# Patient Record
Sex: Female | Born: 1935 | Race: White | Hispanic: No | Smoking: Never smoker
Health system: Southern US, Community
[De-identification: ages and names within clinical notes are randomized; demographics above are authoritative.]

## PROBLEM LIST (undated history)

## (undated) DIAGNOSIS — C50919 Malignant neoplasm of unspecified site of unspecified female breast: Secondary | ICD-10-CM

## (undated) DIAGNOSIS — K5730 Diverticulosis of large intestine without perforation or abscess without bleeding: Secondary | ICD-10-CM

## (undated) DIAGNOSIS — M1990 Unspecified osteoarthritis, unspecified site: Secondary | ICD-10-CM

## (undated) DIAGNOSIS — Z9882 Breast implant status: Secondary | ICD-10-CM

## (undated) HISTORY — PX: OTHER SURGICAL HISTORY: SHX169

## (undated) HISTORY — PX: BREAST ENHANCEMENT SURGERY: SHX7

## (undated) HISTORY — PX: TONSILLECTOMY: SUR1361

## (undated) HISTORY — DX: Breast implant status: Z98.82

## (undated) HISTORY — DX: Malignant neoplasm of unspecified site of unspecified female breast: C50.919

## (undated) HISTORY — PX: MASTECTOMY: SHX3

## (undated) HISTORY — DX: Unspecified osteoarthritis, unspecified site: M19.90

## (undated) HISTORY — DX: Diverticulosis of large intestine without perforation or abscess without bleeding: K57.30

## (undated) HISTORY — PX: APPENDECTOMY: SHX54

---

## 1997-07-26 ENCOUNTER — Encounter: Payer: Self-pay | Admitting: Internal Medicine

## 1997-12-06 ENCOUNTER — Ambulatory Visit (HOSPITAL_COMMUNITY): Admission: RE | Admit: 1997-12-06 | Discharge: 1997-12-06 | Payer: Self-pay | Admitting: Internal Medicine

## 1998-09-27 ENCOUNTER — Other Ambulatory Visit: Admission: RE | Admit: 1998-09-27 | Discharge: 1998-09-27 | Payer: Self-pay | Admitting: Internal Medicine

## 2001-09-04 ENCOUNTER — Other Ambulatory Visit: Admission: RE | Admit: 2001-09-04 | Discharge: 2001-09-04 | Payer: Self-pay | Admitting: Internal Medicine

## 2004-03-13 ENCOUNTER — Ambulatory Visit: Payer: Self-pay | Admitting: Internal Medicine

## 2004-07-31 ENCOUNTER — Ambulatory Visit: Payer: Self-pay | Admitting: Internal Medicine

## 2004-08-07 ENCOUNTER — Ambulatory Visit: Payer: Self-pay | Admitting: Internal Medicine

## 2004-08-18 ENCOUNTER — Ambulatory Visit: Payer: Self-pay | Admitting: Internal Medicine

## 2005-02-05 ENCOUNTER — Ambulatory Visit: Payer: Self-pay | Admitting: Internal Medicine

## 2005-02-12 ENCOUNTER — Ambulatory Visit: Payer: Self-pay | Admitting: Family Medicine

## 2005-08-13 ENCOUNTER — Ambulatory Visit: Payer: Self-pay | Admitting: Internal Medicine

## 2005-09-10 ENCOUNTER — Ambulatory Visit: Payer: Self-pay | Admitting: Internal Medicine

## 2005-12-10 ENCOUNTER — Ambulatory Visit: Payer: Self-pay | Admitting: Internal Medicine

## 2006-02-20 LAB — HM MAMMOGRAPHY: HM Mammogram: NORMAL

## 2006-05-27 ENCOUNTER — Ambulatory Visit: Payer: Self-pay | Admitting: Internal Medicine

## 2006-05-27 LAB — CONVERTED CEMR LAB
BUN: 22 mg/dL (ref 6–23)
Creatinine, Ser: 1.2 mg/dL (ref 0.4–1.2)
Potassium: 4.7 meq/L (ref 3.5–5.1)

## 2006-11-12 DIAGNOSIS — M1990 Unspecified osteoarthritis, unspecified site: Secondary | ICD-10-CM | POA: Insufficient documentation

## 2006-11-12 DIAGNOSIS — M810 Age-related osteoporosis without current pathological fracture: Secondary | ICD-10-CM | POA: Insufficient documentation

## 2006-11-22 DIAGNOSIS — Z853 Personal history of malignant neoplasm of breast: Secondary | ICD-10-CM

## 2006-11-25 ENCOUNTER — Ambulatory Visit: Payer: Self-pay | Admitting: Internal Medicine

## 2006-11-25 DIAGNOSIS — I10 Essential (primary) hypertension: Secondary | ICD-10-CM | POA: Insufficient documentation

## 2007-05-26 ENCOUNTER — Ambulatory Visit: Payer: Self-pay | Admitting: Internal Medicine

## 2007-05-26 LAB — CONVERTED CEMR LAB
BUN: 21 mg/dL (ref 6–23)
Basophils Absolute: 0 10*3/uL (ref 0.0–0.1)
Basophils Relative: 0 % (ref 0.0–1.0)
CO2: 32 meq/L (ref 19–32)
Calcium: 10.6 mg/dL — ABNORMAL HIGH (ref 8.4–10.5)
Chloride: 102 meq/L (ref 96–112)
Creatinine, Ser: 1.2 mg/dL (ref 0.4–1.2)
Eosinophils Absolute: 0.1 10*3/uL (ref 0.0–0.6)
Eosinophils Relative: 1.3 % (ref 0.0–5.0)
GFR calc Af Amer: 55 mL/min
GFR calc non Af Amer: 46 mL/min
Glucose, Bld: 80 mg/dL (ref 70–99)
HCT: 42.7 % (ref 36.0–46.0)
Hemoglobin: 14.4 g/dL (ref 12.0–15.0)
Lymphocytes Relative: 22.3 % (ref 12.0–46.0)
MCHC: 33.6 g/dL (ref 30.0–36.0)
MCV: 95.2 fL (ref 78.0–100.0)
Monocytes Absolute: 0.7 10*3/uL (ref 0.2–0.7)
Monocytes Relative: 11 % (ref 3.0–11.0)
Neutro Abs: 4.3 10*3/uL (ref 1.4–7.7)
Neutrophils Relative %: 65.4 % (ref 43.0–77.0)
Platelets: 194 10*3/uL (ref 150–400)
Potassium: 5.6 meq/L — ABNORMAL HIGH (ref 3.5–5.1)
RBC: 4.48 M/uL (ref 3.87–5.11)
RDW: 13.1 % (ref 11.5–14.6)
Sodium: 143 meq/L (ref 135–145)
WBC: 6.5 10*3/uL (ref 4.5–10.5)

## 2007-05-28 ENCOUNTER — Encounter: Payer: Self-pay | Admitting: Internal Medicine

## 2007-05-30 ENCOUNTER — Ambulatory Visit: Payer: Self-pay | Admitting: Internal Medicine

## 2007-06-02 ENCOUNTER — Ambulatory Visit: Payer: Self-pay | Admitting: Internal Medicine

## 2007-06-02 ENCOUNTER — Encounter: Payer: Self-pay | Admitting: Internal Medicine

## 2007-06-02 LAB — HM COLONOSCOPY

## 2007-12-08 ENCOUNTER — Ambulatory Visit: Payer: Self-pay | Admitting: Internal Medicine

## 2007-12-08 DIAGNOSIS — K5730 Diverticulosis of large intestine without perforation or abscess without bleeding: Secondary | ICD-10-CM | POA: Insufficient documentation

## 2008-01-15 ENCOUNTER — Ambulatory Visit: Payer: Self-pay | Admitting: Internal Medicine

## 2008-01-15 DIAGNOSIS — IMO0002 Reserved for concepts with insufficient information to code with codable children: Secondary | ICD-10-CM

## 2008-01-16 ENCOUNTER — Ambulatory Visit: Payer: Self-pay | Admitting: Internal Medicine

## 2008-04-05 ENCOUNTER — Ambulatory Visit: Payer: Self-pay | Admitting: Internal Medicine

## 2008-04-05 DIAGNOSIS — E785 Hyperlipidemia, unspecified: Secondary | ICD-10-CM

## 2008-04-05 DIAGNOSIS — R351 Nocturia: Secondary | ICD-10-CM | POA: Insufficient documentation

## 2008-04-05 LAB — CONVERTED CEMR LAB
BUN: 17 mg/dL (ref 6–23)
Basophils Absolute: 0 10*3/uL (ref 0.0–0.1)
Basophils Relative: 0.5 % (ref 0.0–3.0)
CO2: 31 meq/L (ref 19–32)
Calcium: 9.2 mg/dL (ref 8.4–10.5)
Chloride: 104 meq/L (ref 96–112)
Cholesterol: 244 mg/dL (ref 0–200)
Creatinine, Ser: 0.9 mg/dL (ref 0.4–1.2)
Direct LDL: 120.8 mg/dL
Eosinophils Absolute: 0.1 10*3/uL (ref 0.0–0.7)
Eosinophils Relative: 0.8 % (ref 0.0–5.0)
GFR calc Af Amer: 77 mL/min
GFR calc non Af Amer: 64 mL/min
Glucose, Bld: 79 mg/dL (ref 70–99)
HCT: 40.7 % (ref 36.0–46.0)
HDL: 101.7 mg/dL (ref >39.0)
Hemoglobin: 13.5 g/dL (ref 12.0–15.0)
Lymphocytes Relative: 24.6 % (ref 12.0–46.0)
MCHC: 33.1 g/dL (ref 30.0–36.0)
MCV: 98.2 fL (ref 78.0–100.0)
Monocytes Absolute: 0.6 10*3/uL (ref 0.1–1.0)
Monocytes Relative: 7.9 % (ref 3.0–12.0)
Neutro Abs: 4.8 10*3/uL (ref 1.4–7.7)
Neutrophils Relative %: 66.2 % (ref 43.0–77.0)
Platelets: 194 10*3/uL (ref 150–400)
Potassium: 3.5 meq/L (ref 3.5–5.1)
RBC: 4.15 M/uL (ref 3.87–5.11)
RDW: 13.1 % (ref 11.5–14.6)
Sodium: 143 meq/L (ref 135–145)
TSH: 1.94 microintl units/mL (ref 0.35–5.50)
Total CHOL/HDL Ratio: 2.4
Triglycerides: 90 mg/dL (ref 0–149)
VLDL: 18 mg/dL (ref 0–40)
WBC: 7.3 10*3/uL (ref 4.5–10.5)

## 2008-06-07 ENCOUNTER — Ambulatory Visit: Payer: Self-pay | Admitting: Internal Medicine

## 2008-07-05 ENCOUNTER — Telehealth: Payer: Self-pay | Admitting: Internal Medicine

## 2008-08-09 ENCOUNTER — Ambulatory Visit: Payer: Self-pay | Admitting: Internal Medicine

## 2008-08-09 DIAGNOSIS — R071 Chest pain on breathing: Secondary | ICD-10-CM

## 2008-09-08 ENCOUNTER — Ambulatory Visit: Payer: Self-pay | Admitting: Internal Medicine

## 2008-09-08 LAB — CONVERTED CEMR LAB
BUN: 17 mg/dL (ref 6–23)
CO2: 32 meq/L (ref 19–32)
Calcium: 9.3 mg/dL (ref 8.4–10.5)
Chloride: 107 meq/L (ref 96–112)
Creatinine, Ser: 1.2 mg/dL (ref 0.4–1.2)
GFR calc non Af Amer: 45.47 mL/min (ref >60)
Glucose, Bld: 79 mg/dL (ref 70–99)
Potassium: 4.2 meq/L (ref 3.5–5.1)
Sodium: 143 meq/L (ref 135–145)

## 2008-11-02 ENCOUNTER — Telehealth: Payer: Self-pay | Admitting: Internal Medicine

## 2008-11-05 ENCOUNTER — Ambulatory Visit: Payer: Self-pay | Admitting: Internal Medicine

## 2008-11-05 DIAGNOSIS — I80299 Phlebitis and thrombophlebitis of other deep vessels of unspecified lower extremity: Secondary | ICD-10-CM

## 2008-11-09 ENCOUNTER — Encounter: Payer: Self-pay | Admitting: Internal Medicine

## 2008-11-09 ENCOUNTER — Ambulatory Visit: Payer: Self-pay

## 2008-12-10 ENCOUNTER — Ambulatory Visit: Payer: Self-pay | Admitting: Internal Medicine

## 2009-01-11 ENCOUNTER — Telehealth: Payer: Self-pay | Admitting: Internal Medicine

## 2009-01-12 ENCOUNTER — Ambulatory Visit: Payer: Self-pay | Admitting: Internal Medicine

## 2009-01-12 DIAGNOSIS — M2500 Hemarthrosis, unspecified joint: Secondary | ICD-10-CM | POA: Insufficient documentation

## 2009-01-14 ENCOUNTER — Encounter: Payer: Self-pay | Admitting: Internal Medicine

## 2009-01-24 ENCOUNTER — Telehealth: Payer: Self-pay | Admitting: Internal Medicine

## 2009-01-25 ENCOUNTER — Ambulatory Visit: Payer: Self-pay | Admitting: Internal Medicine

## 2009-03-11 ENCOUNTER — Ambulatory Visit: Payer: Self-pay | Admitting: Internal Medicine

## 2009-03-11 DIAGNOSIS — M109 Gout, unspecified: Secondary | ICD-10-CM | POA: Insufficient documentation

## 2009-03-11 LAB — CONVERTED CEMR LAB
BUN: 20 mg/dL (ref 6–23)
CO2: 27 meq/L (ref 19–32)
Calcium: 9 mg/dL (ref 8.4–10.5)
Chloride: 102 meq/L (ref 96–112)
Creatinine, Ser: 1.1 mg/dL (ref 0.4–1.2)
GFR calc non Af Amer: 50.21 mL/min (ref >60)
Glucose, Bld: 91 mg/dL (ref 70–99)
Potassium: 3.9 meq/L (ref 3.5–5.1)
Sodium: 138 meq/L (ref 135–145)
Uric Acid, Serum: 9.2 mg/dL — ABNORMAL HIGH (ref 2.4–7.0)

## 2009-03-30 ENCOUNTER — Telehealth: Payer: Self-pay | Admitting: Internal Medicine

## 2009-03-31 IMAGING — CR DG LUMBAR SPINE COMPLETE 4+V
5 series · 5 of 5 positions shown · non-contrast
Comparison: None available.

CLINICAL DATA: Back pain radiculopathy.

LUMBAR SPINE - COMPLETE 4+ VIEW

[view not recorded (1 of 5)]
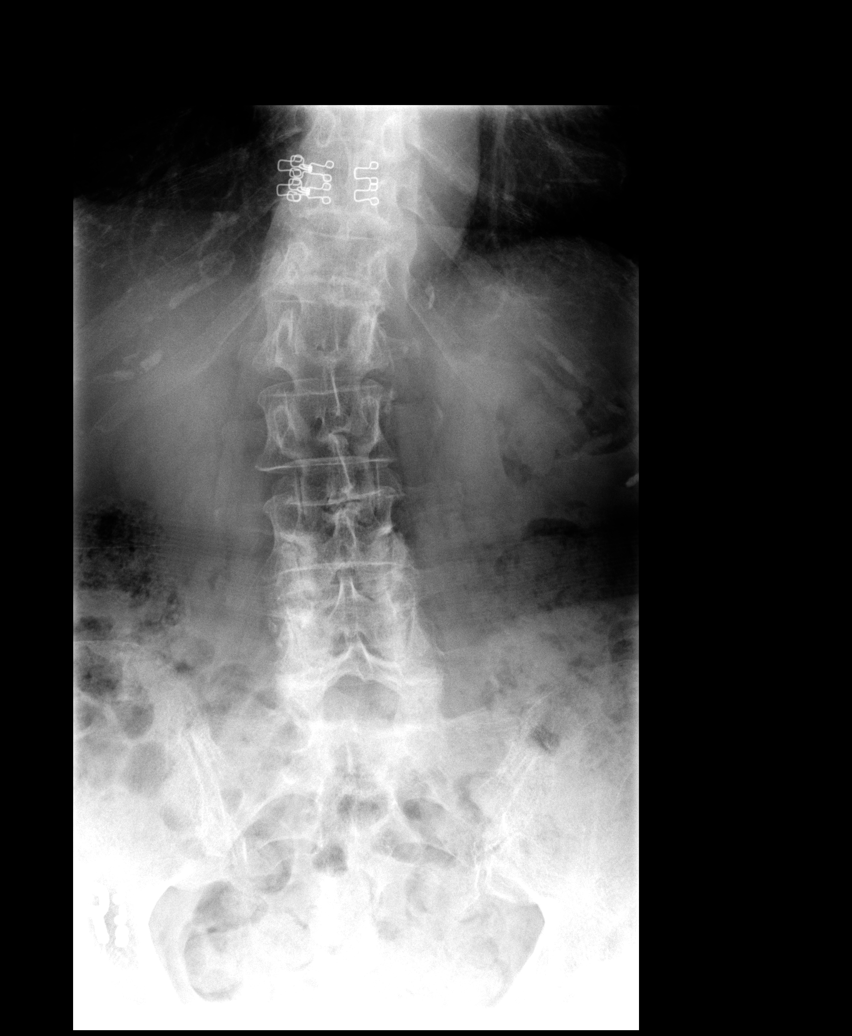

[view not recorded (2 of 5)]
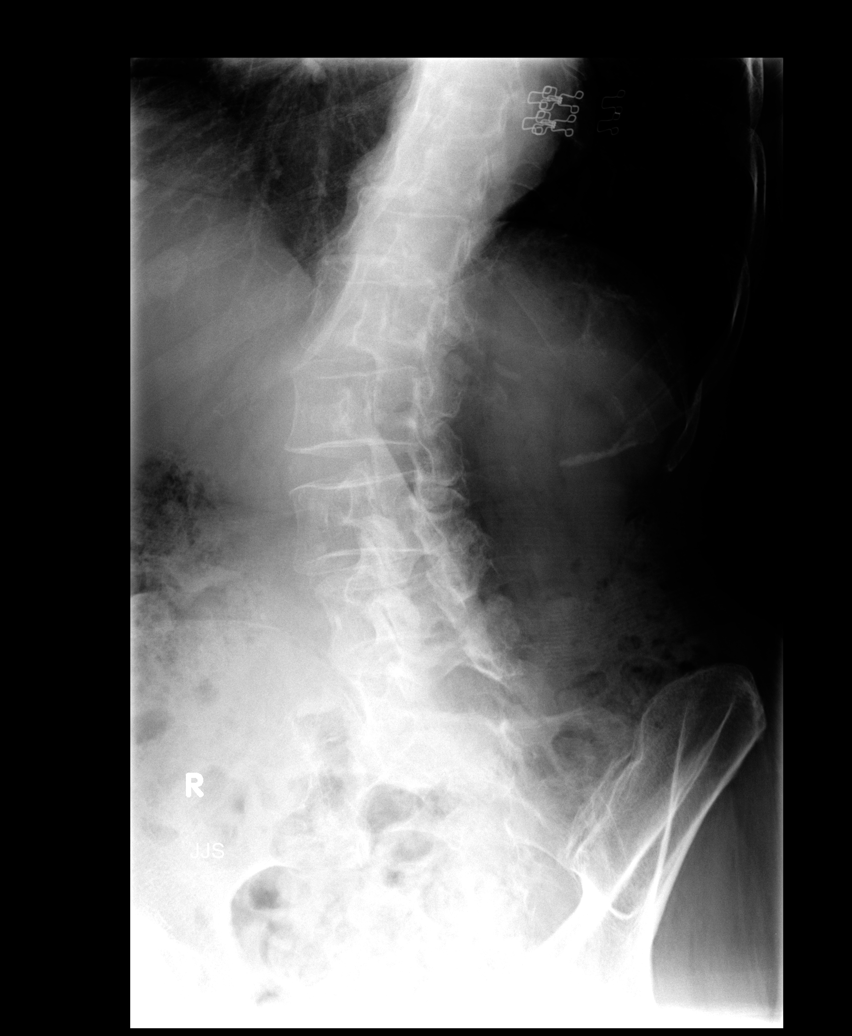

[view not recorded (3 of 5)]
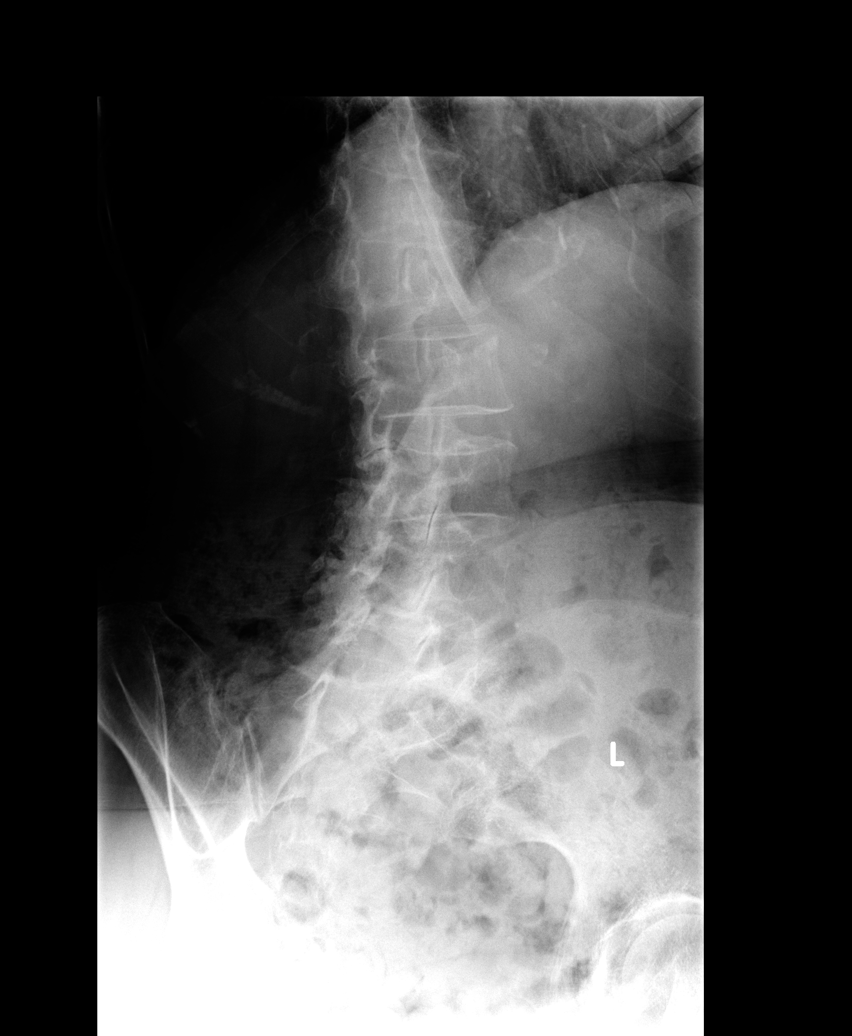

[view not recorded (4 of 5)]
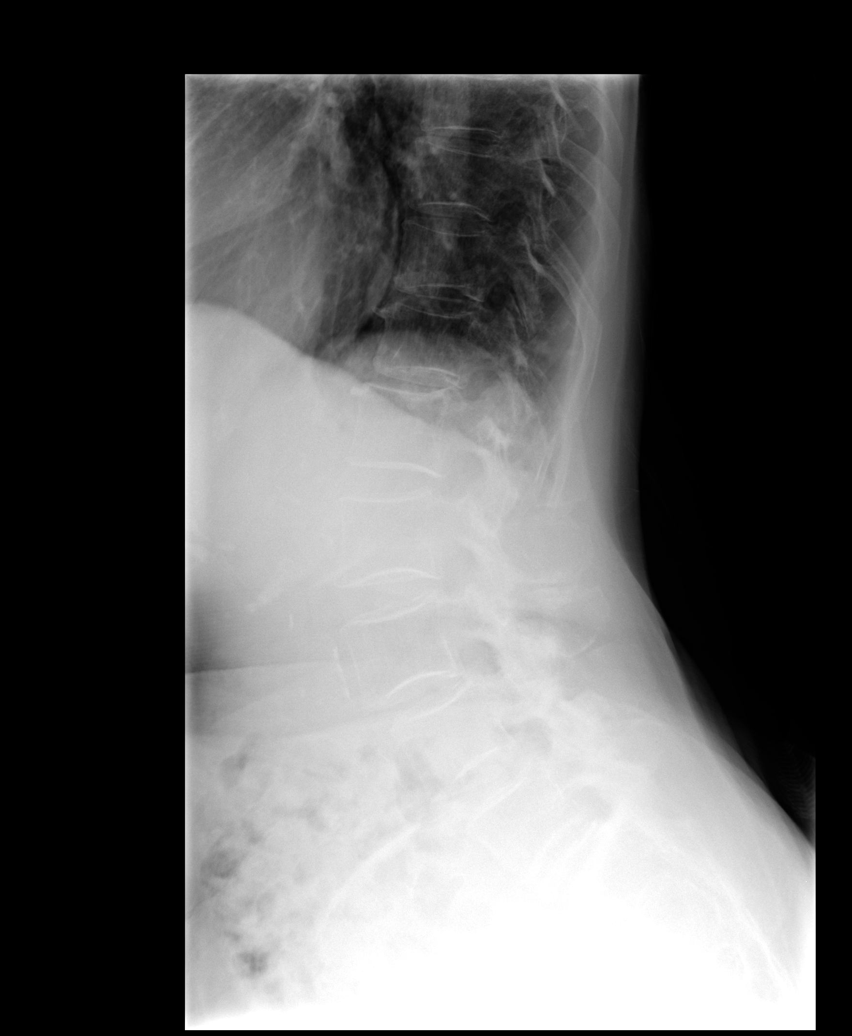

[view not recorded (5 of 5)]
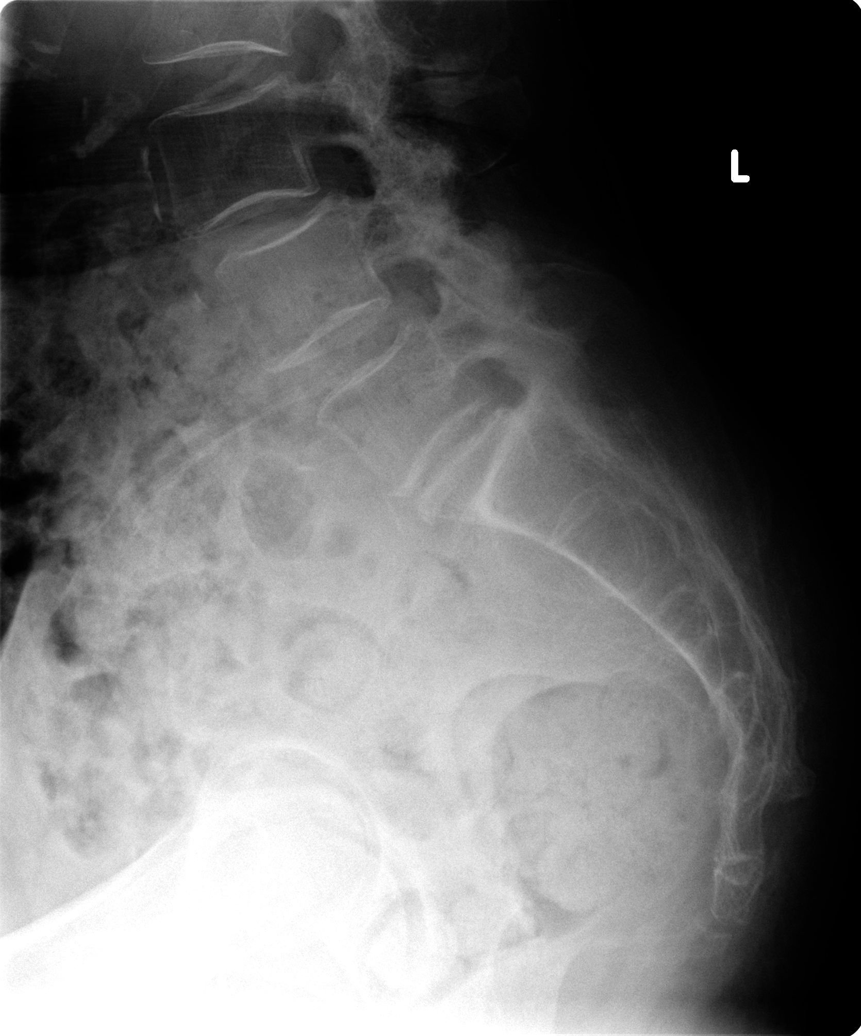

[5 of 5 positions shown; findings below may reference images not displayed]

FINDINGS: The patient has marked multilevel degenerative disease of
the lumbar spine with severe facet arthropathy noted.  There is
mild anterolisthesis of L3 and L 4 due to facet degenerative
change.  Soft tissue structures unremarkable.
IMPRESSION: No acute finding with multilevel degenerative disease noted.

## 2009-04-08 ENCOUNTER — Ambulatory Visit: Payer: Self-pay | Admitting: Internal Medicine

## 2009-07-07 ENCOUNTER — Ambulatory Visit: Payer: Self-pay | Admitting: Internal Medicine

## 2009-07-07 LAB — CONVERTED CEMR LAB
CO2: 30 meq/L (ref 19–32)
Calcium: 9 mg/dL (ref 8.4–10.5)
Creatinine, Ser: 1.2 mg/dL (ref 0.4–1.2)
Direct LDL: 133.6 mg/dL
GFR calc non Af Amer: 45.38 mL/min (ref >60)
Glucose, Bld: 74 mg/dL (ref 70–99)
HDL: 73 mg/dL (ref >39.00)
TSH: 3.9 microintl units/mL (ref 0.35–5.50)

## 2010-01-06 ENCOUNTER — Ambulatory Visit: Payer: Self-pay | Admitting: Internal Medicine

## 2010-01-06 LAB — CONVERTED CEMR LAB
BUN: 39 mg/dL — ABNORMAL HIGH (ref 6–23)
Calcium: 9.3 mg/dL (ref 8.4–10.5)
GFR calc non Af Amer: 29.13 mL/min (ref >60)
Glucose, Bld: 83 mg/dL (ref 70–99)

## 2010-02-23 ENCOUNTER — Telehealth: Payer: Self-pay | Admitting: Internal Medicine

## 2010-02-23 DIAGNOSIS — H906 Mixed conductive and sensorineural hearing loss, bilateral: Secondary | ICD-10-CM | POA: Insufficient documentation

## 2010-05-05 ENCOUNTER — Ambulatory Visit
Admission: RE | Admit: 2010-05-05 | Discharge: 2010-05-05 | Payer: Self-pay | Source: Home / Self Care | Attending: Internal Medicine | Admitting: Internal Medicine

## 2010-05-05 ENCOUNTER — Other Ambulatory Visit: Payer: Self-pay | Admitting: Internal Medicine

## 2010-05-05 DIAGNOSIS — N182 Chronic kidney disease, stage 2 (mild): Secondary | ICD-10-CM | POA: Insufficient documentation

## 2010-05-05 LAB — BASIC METABOLIC PANEL
BUN: 36 mg/dL — ABNORMAL HIGH (ref 6–23)
CO2: 31 mEq/L (ref 19–32)
Calcium: 9.1 mg/dL (ref 8.4–10.5)
Chloride: 101 mEq/L (ref 96–112)
Creatinine, Ser: 1.8 mg/dL — ABNORMAL HIGH (ref 0.4–1.2)
GFR: 27.83 mL/min — ABNORMAL LOW (ref >60.00)
Glucose, Bld: 95 mg/dL (ref 70–99)
Potassium: 4.2 mEq/L (ref 3.5–5.1)
Sodium: 140 mEq/L (ref 135–145)

## 2010-05-05 LAB — LIPID PANEL
Cholesterol: 213 mg/dL — ABNORMAL HIGH (ref 0–200)
HDL: 55 mg/dL (ref >39.00)
Total CHOL/HDL Ratio: 4
Triglycerides: 119 mg/dL (ref 0.0–149.0)
VLDL: 23.8 mg/dL (ref 0.0–40.0)

## 2010-05-05 LAB — URIC ACID: Uric Acid, Serum: 9 mg/dL — ABNORMAL HIGH (ref 2.4–7.0)

## 2010-05-05 LAB — LDL CHOLESTEROL, DIRECT: Direct LDL: 140 mg/dL

## 2010-05-22 ENCOUNTER — Telehealth: Payer: Self-pay | Admitting: Internal Medicine

## 2010-05-23 NOTE — Assessment & Plan Note (Signed)
Summary: 6 MONTH FUP//CCM   Vital Signs:  Patient profile:   77 year old female Height:      62 inches Weight:      110 pounds BMI:     20.19 Temp:     98.2 degrees F oral Pulse rate:   76 / minute Resp:     14 per minute BP sitting:   136 / 72  (left arm)  Vitals Entered By: Willy Eddy, LPN (January 06, 2010 1:50 PM) CC: roa Is Patient Diabetic? No   Primary Care Provider:  Stacie Glaze MD  CC:  roa.  History of Present Illness: follow up for osteoporosis and vit d therapy   Follow-Up Visit      This is an 77 year old woman who presents for Follow-up visit.  The patient denies chest pain, palpitations, dizziness, syncope, low blood sugar symptoms, high blood sugar symptoms, edema, SOB, DOE, PND, and orthopnea.  Since the last visit the patient notes no new problems or concerns.  The patient reports taking meds as prescribed.  When questioned about possible medication side effects, the patient notes none.    Gout is "ok" back pain is recurrent and tramadol use is daily  no dysuria or GU symptoms  Preventive Screening-Counseling & Management  Alcohol-Tobacco     Smoking Status: never     Passive Smoke Exposure: no     Tobacco Counseling: not indicated; no tobacco use  Problems Prior to Update: 1)  Acute Gouty Arthropathy  (ICD-274.01) 2)  Hemarthrosis  (ICD-719.10) 3)  Phlebitis&thrombophleb Oth Deep Ves Lower Extrem  (ICD-451.19) 4)  Chest Wall Pain, Anterior  (ICD-786.52) 5)  Nocturia  (ICD-788.43) 6)  Hyperlipidemia, Borderline  (ICD-272.4) 7)  Back Pain With Radiculopathy  (ICD-729.2) 8)  Diverticulosis, Colon  (ICD-562.10) 9)  Preventive Health Care  (ICD-V70.0) 10)  Hypertension, Essential Nos  (ICD-401.9) 11)  Breast Cancer, Hx of  (ICD-V10.3) 12)  Osteoporosis  (ICD-733.00) 13)  Osteoarthritis  (ICD-715.90)  Current Problems (verified): 1)  Acute Gouty Arthropathy  (ICD-274.01) 2)  Hemarthrosis  (ICD-719.10) 3)  Phlebitis&thrombophleb Oth  Deep Ves Lower Extrem  (ICD-451.19) 4)  Chest Wall Pain, Anterior  (ICD-786.52) 5)  Nocturia  (ICD-788.43) 6)  Hyperlipidemia, Borderline  (ICD-272.4) 7)  Back Pain With Radiculopathy  (ICD-729.2) 8)  Diverticulosis, Colon  (ICD-562.10) 9)  Preventive Health Care  (ICD-V70.0) 10)  Hypertension, Essential Nos  (ICD-401.9) 11)  Breast Cancer, Hx of  (ICD-V10.3) 12)  Osteoporosis  (ICD-733.00) 13)  Osteoarthritis  (ICD-715.90)  Medications Prior to Update: 1)  Bayer Aspirin 325 Mg  Tabs (Aspirin) .... Once Daily 2)  Multivitamins   Tabs (Multiple Vitamin) .... Once Daily 3)  Calcium 600 1500 Mg  Tabs (Calcium Carbonate) .... Once Daily 4)  Furosemide 40 Mg Tabs (Furosemide) .... One By Mouth Daily 5)  Mobic 15 Mg Tabs (Meloxicam) .Marland Kitchen.. 1 By Mouth Once Daily 6)  Tramadol Hcl 50 Mg Tabs (Tramadol Hcl) .... One By Mouth Q 6 Hours As Needed Pain 7)  Detrol La 4 Mg Xr24h-Cap (Tolterodine Tartrate) .... One By Mouth Bid 8)  Klor-Con 8 Meq Cr-Tabs (Potassium Chloride) .... One By Mouth  Daily 9)  Allopurinol 100 Mg Tabs (Allopurinol) .... One By Mouth Q Hs  Current Medications (verified): 1)  Bayer Aspirin 325 Mg  Tabs (Aspirin) .... Once Daily 2)  Multivitamins   Tabs (Multiple Vitamin) .... Once Daily 3)  Calcium 600 1500 Mg  Tabs (Calcium Carbonate) .... Once Daily  4)  Furosemide 40 Mg Tabs (Furosemide) .... One By Mouth Daily 5)  Mobic 15 Mg Tabs (Meloxicam) .Marland Kitchen.. 1 By Mouth Once Daily 6)  Tramadol Hcl 50 Mg Tabs (Tramadol Hcl) .... One By Mouth Q 6 Hours As Needed Pain 7)  Detrol La 4 Mg Xr24h-Cap (Tolterodine Tartrate) .... One By Mouth Bid 8)  Klor-Con 8 Meq Cr-Tabs (Potassium Chloride) .... One By Mouth  Daily 9)  Allopurinol 100 Mg Tabs (Allopurinol) .... One By Mouth Q Hs 10)  Ergocalciferol 50000 Unit Caps (Ergocalciferol) .... One By Mouth Weekly  Allergies (verified): 1)  ! Codeine  Past History:  Family History: Last updated: 11/12/2006 Family History of Prostate CA 1st  degree relative <50 Family History of Stroke F 1st degree relative <60 Family History Other cancer-Breast  Social History: Last updated: 11/25/2006 Retired Single Never Smoked Alcohol use-no  Risk Factors: Smoking Status: never (01/06/2010) Passive Smoke Exposure: no (01/06/2010)  Past medical, surgical, family and social histories (including risk factors) reviewed, and no changes noted (except as noted below).  Past Medical History: Reviewed history from 12/08/2007 and no changes required. Osteoarthritis Osteoporosis Breast cancer, hx of breat implants Diverticulosis, colon  Past Surgical History: Reviewed history from 11/25/2006 and no changes required. Colonoscopy-2001 Mastectomy breast implant implant removal Tonsillectomy Appendectomy  Family History: Reviewed history from 11/12/2006 and no changes required. Family History of Prostate CA 1st degree relative <50 Family History of Stroke F 1st degree relative <60 Family History Other cancer-Breast  Social History: Reviewed history from 11/25/2006 and no changes required. Retired Single Never Smoked Alcohol use-no  Review of Systems       Flu Vaccine Consent Questions     Do you have a history of severe allergic reactions to this vaccine? no    Any prior history of allergic reactions to egg and/or gelatin? no    Do you have a sensitivity to the preservative Thimersol? no    Do you have a past history of Guillan-Barre Syndrome? no    Do you currently have an acute febrile illness? no    Have you ever had a severe reaction to latex? no    Vaccine information given and explained to patient? yes    Are you currently pregnant? no    Lot Number:[id number]   Exp Date:10/21/2010   Site Given  Left Deltoid IM   Physical Exam  General:  alert, underweight appearing, and pale.   Head:  normocephalic and atraumatic.   Eyes:  pupils equal and pupils round.   Ears:  R ear normal and L ear normal.   Nose:  no  nasal discharge.   Neck:  No deformities, masses, or tenderness noted.supple.   Lungs:  normal respiratory effort, no crackles, and no wheezes.   Heart:  normal rate and regular rhythm.   Abdomen:  Bowel sounds positive,abdomen soft and non-tender without masses, organomegaly or hernias noted. Msk:  joint tenderness, joint warmth, and redness over joint.  over left wrist Extremities:  1+ left pedal edema and 1+ right pedal edema.   Neurologic:  alert & oriented X3 and DTRs symmetrical and normal.     Impression & Recommendations:  Problem # 1:  HYPERLIPIDEMIA, BORDERLINE (ICD-272.4)  Prior 10 Yr Risk Heart Disease: Not enough information (05/26/2007)   HDL:73.00 (07/07/2009), 101.7 (04/05/2008)  LDL:DEL (04/05/2008)  Chol:213 (07/07/2009), 244 (04/05/2008)  Trig:90 (04/05/2008)  Problem # 2:  OSTEOPOROSIS (ICD-733.00) on vitamin d and calcium  Problem # 3:  BREAST CANCER, HX OF (  ICD-V10.3) keeps up with mamograms  Problem # 4:  BACK PAIN WITH RADICULOPATHY (ICD-729.2) on tramdol and mobic stable  Complete Medication List: 1)  Bayer Aspirin 325 Mg Tabs (Aspirin) .... Once daily 2)  Multivitamins Tabs (Multiple vitamin) .... Once daily 3)  Calcium 600 1500 Mg Tabs (Calcium carbonate) .... Once daily 4)  Furosemide 40 Mg Tabs (Furosemide) .... One by mouth daily 5)  Mobic 15 Mg Tabs (Meloxicam) .Marland Kitchen.. 1 by mouth once daily 6)  Tramadol Hcl 50 Mg Tabs (Tramadol hcl) .... One by mouth q 6 hours as needed pain 7)  Detrol La 4 Mg Xr24h-cap (Tolterodine tartrate) .... One by mouth bid 8)  Klor-con 8 Meq Cr-tabs (Potassium chloride) .... One by mouth  daily 9)  Allopurinol 100 Mg Tabs (Allopurinol) .... One by mouth q hs 10)  Ergocalciferol 50000 Unit Caps (Ergocalciferol) .... One by mouth weekly  Other Orders: Flu Vaccine 4yrs + MEDICARE PATIENTS (U9811) Administration Flu vaccine - MCR (G0008) Venipuncture (91478) TLB-BMP (Basic Metabolic Panel-BMET) (80048-METABOL)  Patient  Instructions: 1)  Please schedule a follow-up appointment in 4 months. Prescriptions: ERGOCALCIFEROL 50000 UNIT CAPS (ERGOCALCIFEROL) one by mouth weekly  #5 x 3   Entered and Authorized by:   Stacie Glaze MD   Signed by:   Stacie Glaze MD on 01/06/2010   Method used:   Electronically to        [facility]  [place] [address] (retail)       [address]       [place], [place]  [address]       Ph: [phone] or [phone]       Fax: [phone]   RxID:   [id number]   Appended Document: Orders Update     Clinical Lists Changes  Orders: Added new Service order of Specimen Handling (87564) - Signed

## 2010-05-23 NOTE — Assessment & Plan Note (Signed)
Summary: 3 month rov/njr   Vital Signs:  Patient profile:   77 year old female Height:      64 inches Weight:      114 pounds Temp:     97.9 degrees F oral Pulse rate:   72 / minute Resp:     14 per minute BP sitting:   142 / 80  (left arm)  Vitals Entered By: Willy Eddy, LPN (July 07, 2009 11:31 AM) CC: roa   CC:  roa.  History of Present Illness:  Follow-Up Visit      This is an 77 year old woman who presents for Follow-up visit.  monitering HTN and lipids as well as her use of boniva for osteoporosis.  The patient denies chest pain, palpitations, dizziness, syncope, low blood sugar symptoms, high blood sugar symptoms, edema, SOB, DOE, PND, and orthopnea.  Since the last visit the patient notes no new problems or concerns.  The patient reports taking meds as prescribed.  When questioned about possible medication side effects, the patient notes none.    Preventive Screening-Counseling & Management  Alcohol-Tobacco     Smoking Status: never     Passive Smoke Exposure: no  Problems Prior to Update: 1)  Acute Gouty Arthropathy  (ICD-274.01) 2)  Hemarthrosis  (ICD-719.10) 3)  Phlebitis&thrombophleb Oth Deep Ves Lower Extrem  (ICD-451.19) 4)  Chest Wall Pain, Anterior  (ICD-786.52) 5)  Nocturia  (ICD-788.43) 6)  Hyperlipidemia, Borderline  (ICD-272.4) 7)  Back Pain With Radiculopathy  (ICD-729.2) 8)  Diverticulosis, Colon  (ICD-562.10) 9)  Preventive Health Care  (ICD-V70.0) 10)  Hypertension, Essential Nos  (ICD-401.9) 11)  Breast Cancer, Hx of  (ICD-V10.3) 12)  Osteoporosis  (ICD-733.00) 13)  Osteoarthritis  (ICD-715.90)  Current Problems (verified): 1)  Acute Gouty Arthropathy  (ICD-274.01) 2)  Hemarthrosis  (ICD-719.10) 3)  Phlebitis&thrombophleb Oth Deep Ves Lower Extrem  (ICD-451.19) 4)  Chest Wall Pain, Anterior  (ICD-786.52) 5)  Nocturia  (ICD-788.43) 6)  Hyperlipidemia, Borderline  (ICD-272.4) 7)  Back Pain With Radiculopathy  (ICD-729.2) 8)   Diverticulosis, Colon  (ICD-562.10) 9)  Preventive Health Care  (ICD-V70.0) 10)  Hypertension, Essential Nos  (ICD-401.9) 11)  Breast Cancer, Hx of  (ICD-V10.3) 12)  Osteoporosis  (ICD-733.00) 13)  Osteoarthritis  (ICD-715.90)  Medications Prior to Update: 1)  Bayer Aspirin 325 Mg  Tabs (Aspirin) .... Once Daily 2)  Multivitamins   Tabs (Multiple Vitamin) .... Once Daily 3)  Calcium 600 1500 Mg  Tabs (Calcium Carbonate) .... Once Daily 4)  Boniva 150 Mg  Tabs (Ibandronate Sodium) .... One Every Month 5)  Furosemide 40 Mg Tabs (Furosemide) .... One By Mouth Daily 6)  Mobic 15 Mg Tabs (Meloxicam) .Marland Kitchen.. 1 By Mouth Once Daily 7)  Tramadol Hcl 50 Mg Tabs (Tramadol Hcl) .... One By Mouth Q 6 Hours As Needed Pain 8)  Detrol La 4 Mg Xr24h-Cap (Tolterodine Tartrate) .... One By Mouth Bid 9)  Klor-Con 8 Meq Cr-Tabs (Potassium Chloride) .... One By Mouth  Daily 10)  Allopurinol 100 Mg Tabs (Allopurinol) .... One By Mouth Q Hs  Current Medications (verified): 1)  Bayer Aspirin 325 Mg  Tabs (Aspirin) .... Once Daily 2)  Multivitamins   Tabs (Multiple Vitamin) .... Once Daily 3)  Calcium 600 1500 Mg  Tabs (Calcium Carbonate) .... Once Daily 4)  Furosemide 40 Mg Tabs (Furosemide) .... One By Mouth Daily 5)  Mobic 15 Mg Tabs (Meloxicam) .Marland Kitchen.. 1 By Mouth Once Daily 6)  Tramadol Hcl 50 Mg  Tabs (Tramadol Hcl) .... One By Mouth Q 6 Hours As Needed Pain 7)  Detrol La 4 Mg Xr24h-Cap (Tolterodine Tartrate) .... One By Mouth Bid 8)  Klor-Con 8 Meq Cr-Tabs (Potassium Chloride) .... One By Mouth  Daily 9)  Allopurinol 100 Mg Tabs (Allopurinol) .... One By Mouth Q Hs  Allergies (verified): 1)  ! Codeine  Past History:  Family History: Last updated: 11/12/2006 Family History of Prostate CA 1st degree relative <50 Family History of Stroke F 1st degree relative <60 Family History Other cancer-Breast  Social History: Last updated: 11/25/2006 Retired Single Never Smoked Alcohol use-no  Risk  Factors: Smoking Status: never (07/07/2009) Passive Smoke Exposure: no (07/07/2009)  Past medical, surgical, family and social histories (including risk factors) reviewed, and no changes noted (except as noted below).  Past Medical History: Reviewed history from 12/08/2007 and no changes required. Osteoarthritis Osteoporosis Breast cancer, hx of breat implants Diverticulosis, colon  Past Surgical History: Reviewed history from 11/25/2006 and no changes required. Colonoscopy-2001 Mastectomy breast implant implant removal Tonsillectomy Appendectomy  Family History: Reviewed history from 11/12/2006 and no changes required. Family History of Prostate CA 1st degree relative <50 Family History of Stroke F 1st degree relative <60 Family History Other cancer-Breast  Social History: Reviewed history from 11/25/2006 and no changes required. Retired Single Never Smoked Alcohol use-no  Review of Systems  The patient denies anorexia, fever, weight loss, weight gain, vision loss, decreased hearing, hoarseness, chest pain, syncope, dyspnea on exertion, peripheral edema, prolonged cough, headaches, hemoptysis, abdominal pain, melena, hematochezia, severe indigestion/heartburn, hematuria, incontinence, genital sores, muscle weakness, suspicious skin lesions, transient blindness, difficulty walking, depression, unusual weight change, abnormal bleeding, enlarged lymph nodes, angioedema, and breast masses.    Physical Exam  General:  alert, underweight appearing, and pale.   Head:  normocephalic and atraumatic.   Eyes:  pupils equal and pupils round.   Ears:  R ear normal and L ear normal.   Neck:  No deformities, masses, or tenderness noted.supple.   Lungs:  normal respiratory effort, no crackles, and no wheezes.   Heart:  normal rate and regular rhythm.   Abdomen:  Bowel sounds positive,abdomen soft and non-tender without masses, organomegaly or hernias noted. Msk:  joint tenderness,  joint warmth, and redness over joint.  over left wrist   Impression & Recommendations:  Problem # 1:  HYPERLIPIDEMIA, BORDERLINE (ICD-272.4) moniter lipids Orders: TLB-Cholesterol, HDL (83718-HDL) TLB-Cholesterol, Direct LDL (83721-DIRLDL) TLB-Cholesterol, Total (82465-CHO) TLB-TSH (Thyroid Stimulating Hormone) (84443-TSH)  Prior 10 Yr Risk Heart Disease: Not enough information (05/26/2007)   HDL:101.7 (04/05/2008)  LDL:DEL (04/05/2008)  Chol:244 (04/05/2008)  Trig:90 (04/05/2008)  Problem # 2:  HYPERTENSION, ESSENTIAL NOS (ICD-401.9) moniter potassim and renal function Her updated medication list for this problem includes:    Furosemide 40 Mg Tabs (Furosemide) ..... One by mouth daily  Orders: TLB-BMP (Basic Metabolic Panel-BMET) (80048-METABOL)  BP today: 142/80 Prior BP: 136/76 (04/08/2009)  Prior 10 Yr Risk Heart Disease: Not enough information (05/26/2007)  Labs Reviewed: K+: 3.9 (03/11/2009) Creat: : 1.1 (03/11/2009)   Chol: 244 (04/05/2008)   HDL: 101.7 (04/05/2008)   LDL: DEL (04/05/2008)   TG: 90 (04/05/2008)  Problem # 3:  OSTEOPOROSIS (ICD-733.00) stop bonivs and moniter vit d The following medications were removed from the medication list:    Boniva 150 Mg Tabs (Ibandronate sodium) ..... One every month  Orders: Venipuncture (04540) T-Vitamin D (25-Hydroxy) [phone])  Discussed medication use, applications of heat or ice, and exercises.   Complete Medication List: 1)  Bayer Aspirin 325 Mg Tabs (Aspirin) .... Once daily 2)  Multivitamins Tabs (Multiple vitamin) .... Once daily 3)  Calcium 600 1500 Mg Tabs (Calcium carbonate) .... Once daily 4)  Furosemide 40 Mg Tabs (Furosemide) .... One by mouth daily 5)  Mobic 15 Mg Tabs (Meloxicam) .Marland Kitchen.. 1 by mouth once daily 6)  Tramadol Hcl 50 Mg Tabs (Tramadol hcl) .... One by mouth q 6 hours as needed pain 7)  Detrol La 4 Mg Xr24h-cap (Tolterodine tartrate) .... One by mouth bid 8)  Klor-con 8 Meq Cr-tabs  (Potassium chloride) .... One by mouth  daily 9)  Allopurinol 100 Mg Tabs (Allopurinol) .... One by mouth q hs  Patient Instructions: 1)  THERE IS A FUNGAL INFECTION UNDER THE TOE NAIL 2)  BUT TO TREAT THIS WOULD REQUIRE 3 MONTHS OF A POTENTIALLY TOXIC MEDICINE FOR AN 77 YEAR OLD! 3)  Please schedule a follow-up appointment in 6 months.

## 2010-05-23 NOTE — Progress Notes (Signed)
Summary: referral  Phone Note Call from Patient   Caller: Patient Call For: Stacie Glaze MD Summary of Call: Pt needs a referral from Dr. Lovell Sheehan for a complete hearing test  (Dr. Marciano Sequin)  [phone]  fax,  [phone] phone Initial call taken by: Lynann Beaver CMA AAMA,  February 23, 2010 2:23 PM  New Problems: MIXED HEARING LOSS BILATERAL (ICD-389.22)   New Problems: MIXED HEARING LOSS BILATERAL (ICD-389.22)  Appended Document: referral order sent to terri

## 2010-05-25 NOTE — Assessment & Plan Note (Signed)
Summary: 4 month fup//ccm   Vital Signs:  Patient profile:   77 year old female Height:      62 inches Weight:      112 pounds BMI:     20.56 Temp:     98.2 degrees F oral Pulse rate:   76 / minute Resp:     14 per minute BP sitting:   136 / 76  (left arm)  Vitals Entered By: Willy Eddy, LPN (May 05, 2010 1:43 PM) CC: roa, Hypertension Management Is Patient Diabetic? No   Primary Care Provider:  Stacie Glaze MD  CC:  roa and Hypertension Management.  History of Present Illness: follow up for renal insuficiency on furosemide last creatinine was 1.8 monitering fluid intake and possible dehaydration as a cause complicated by edema in feet and HTN back pain controlled with tramadol and not taking any NSAIDS  he has hyperlipidemia but is now 77 years of age and we discussed with her the continued use of medications to control lipids  at her age  visit history of gout and an acute complaint of gout  Hypertension History:      She denies headache, chest pain, palpitations, dyspnea with exertion, orthopnea, PND, peripheral edema, visual symptoms, neurologic problems, syncope, and side effects from treatment.        Positive major cardiovascular risk factors include female age 7 years old or older, hyperlipidemia, and hypertension.  Negative major cardiovascular risk factors include negative family history for ischemic heart disease and non-tobacco-user status.     Preventive Screening-Counseling & Management  Alcohol-Tobacco     Smoking Status: never     Passive Smoke Exposure: no     Tobacco Counseling: not indicated; no tobacco use  Problems Prior to Update: 1)  Chronic Kidney Disease Stage II (MILD)  (ICD-585.2) 2)  Mixed Hearing Loss Bilateral  (ICD-389.22) 3)  Acute Gouty Arthropathy  (ICD-274.01) 4)  Hemarthrosis  (ICD-719.10) 5)  Phlebitis&thrombophleb Oth Deep Ves Lower Extrem  (ICD-451.19) 6)  Chest Wall Pain, Anterior  (ICD-786.52) 7)  Nocturia   (ICD-788.43) 8)  Hyperlipidemia, Borderline  (ICD-272.4) 9)  Back Pain With Radiculopathy  (ICD-729.2) 10)  Diverticulosis, Colon  (ICD-562.10) 11)  Preventive Health Care  (ICD-V70.0) 12)  Hypertension, Essential Nos  (ICD-401.9) 13)  Breast Cancer, Hx of  (ICD-V10.3) 14)  Osteoporosis  (ICD-733.00) 15)  Osteoarthritis  (ICD-715.90)  Current Problems (verified): 1)  Mixed Hearing Loss Bilateral  (ICD-389.22) 2)  Acute Gouty Arthropathy  (ICD-274.01) 3)  Hemarthrosis  (ICD-719.10) 4)  Phlebitis&thrombophleb Oth Deep Ves Lower Extrem  (ICD-451.19) 5)  Chest Wall Pain, Anterior  (ICD-786.52) 6)  Nocturia  (ICD-788.43) 7)  Hyperlipidemia, Borderline  (ICD-272.4) 8)  Back Pain With Radiculopathy  (ICD-729.2) 9)  Diverticulosis, Colon  (ICD-562.10) 10)  Preventive Health Care  (ICD-V70.0) 11)  Hypertension, Essential Nos  (ICD-401.9) 12)  Breast Cancer, Hx of  (ICD-V10.3) 13)  Osteoporosis  (ICD-733.00) 14)  Osteoarthritis  (ICD-715.90)  Medications Prior to Update: 1)  Bayer Aspirin 325 Mg  Tabs (Aspirin) .... Once Daily 2)  Multivitamins   Tabs (Multiple Vitamin) .... Once Daily 3)  Calcium 600 1500 Mg  Tabs (Calcium Carbonate) .... Once Daily 4)  Furosemide 40 Mg Tabs (Furosemide) .... One By Mouth Daily 5)  Mobic 15 Mg Tabs (Meloxicam) .Marland Kitchen.. 1 By Mouth Once Daily 6)  Tramadol Hcl 50 Mg Tabs (Tramadol Hcl) .... One By Mouth Q 6 Hours As Needed Pain 7)  Detrol La 4 Mg  Xr24h-Cap (Tolterodine Tartrate) .... One By Mouth Bid 8)  Klor-Con 8 Meq Cr-Tabs (Potassium Chloride) .... One By Mouth  Daily 9)  Allopurinol 100 Mg Tabs (Allopurinol) .... One By Mouth Q Hs 10)  Ergocalciferol 50000 Unit Caps (Ergocalciferol) .... One By Mouth Weekly  Current Medications (verified): 1)  Bayer Aspirin 325 Mg  Tabs (Aspirin) .... Once Daily 2)  Multivitamins   Tabs (Multiple Vitamin) .... Once Daily 3)  Calcium 600 1500 Mg  Tabs (Calcium Carbonate) .... Once Daily 4)  Furosemide 40 Mg Tabs  (Furosemide) .... One By Mouth Daily 5)  Mobic 15 Mg Tabs (Meloxicam) .Marland Kitchen.. 1 By Mouth Once Daily 6)  Tramadol Hcl 50 Mg Tabs (Tramadol Hcl) .... One By Mouth Q 6 Hours As Needed Pain 7)  Detrol La 4 Mg Xr24h-Cap (Tolterodine Tartrate) .... One By Mouth Bid 8)  Klor-Con 8 Meq Cr-Tabs (Potassium Chloride) .... One By Mouth  Daily 9)  Allopurinol 100 Mg Tabs (Allopurinol) .... One By Mouth Q Hs 10)  Ergocalciferol 50000 Unit Caps (Ergocalciferol) .... One By Mouth Weekly  Allergies (verified): 1)  ! Codeine  Past History:  Family History: Last updated: 11/12/2006 Family History of Prostate CA 1st degree relative <50 Family History of Stroke F 1st degree relative <60 Family History Other cancer-Breast  Social History: Last updated: 11/25/2006 Retired Single Never Smoked Alcohol use-no  Risk Factors: Smoking Status: never (05/05/2010) Passive Smoke Exposure: no (05/05/2010)  Past medical, surgical, family and social histories (including risk factors) reviewed, and no changes noted (except as noted below).  Past Medical History: Reviewed history from 12/08/2007 and no changes required. Osteoarthritis Osteoporosis Breast cancer, hx of breat implants Diverticulosis, colon  Past Surgical History: Reviewed history from 11/25/2006 and no changes required. Colonoscopy-2001 Mastectomy breast implant implant removal Tonsillectomy Appendectomy  Family History: Reviewed history from 11/12/2006 and no changes required. Family History of Prostate CA 1st degree relative <50 Family History of Stroke F 1st degree relative <60 Family History Other cancer-Breast  Social History: Reviewed history from 11/25/2006 and no changes required. Retired Single Never Smoked Alcohol use-no  Review of Systems  The patient denies anorexia, fever, weight loss, weight gain, vision loss, decreased hearing, hoarseness, chest pain, syncope, dyspnea on exertion, peripheral edema, prolonged  cough, headaches, hemoptysis, abdominal pain, melena, hematochezia, severe indigestion/heartburn, hematuria, incontinence, genital sores, muscle weakness, suspicious skin lesions, transient blindness, difficulty walking, depression, unusual weight change, abnormal bleeding, enlarged lymph nodes, angioedema, and breast masses.    Physical Exam  General:  alert, underweight appearing, and pale.   Head:  normocephalic and atraumatic.   Eyes:  pupils equal and pupils round.   Ears:  R ear normal and L ear normal.   Nose:  no nasal discharge.   Mouth:  pharynx pink and moist.   Neck:  No deformities, masses, or tenderness noted.supple.   Lungs:  normal respiratory effort, no crackles, and no wheezes.   Heart:  normal rate and regular rhythm.   Abdomen:  Bowel sounds positive,abdomen soft and non-tender without masses, organomegaly or hernias noted. Msk:  joint tenderness, joint warmth, and redness over joint.  over left wrist Pulses:  R and L carotid,radial,femoral,dorsalis pedis and posterior tibial pulses are full and equal bilaterally Extremities:  1+ left pedal edema and 1+ right pedal edema.   Neurologic:  alert & oriented X3 and DTRs symmetrical and normal.     Impression & Recommendations:  Problem # 1:  HYPERLIPIDEMIA, BORDERLINE (ICD-272.4)  Prior 10 Yr Risk Heart  Disease: Not enough information (05/26/2007)   HDL:73.00 (07/07/2009), 101.7 (04/05/2008)  LDL:DEL (04/05/2008)  Chol:213 (07/07/2009), 244 (04/05/2008)  Trig:90 (04/05/2008)  Orders: Venipuncture (16109) Specimen Handling (60454) TLB-Lipid Panel (80061-LIPID)  Problem # 2:  ACUTE GOUTY ARTHROPATHY (ICD-274.01) moniter uA Her updated medication list for this problem includes:    Allopurinol 100 Mg Tabs (Allopurinol) ..... One by mouth q hs  Orders: TLB-Uric Acid, Blood (84550-URIC)  Problem # 3:  HYPERTENSION, ESSENTIAL NOS (ICD-401.9) Assessment: Unchanged  Orders: Specimen Handling (09811) TLB-BMP (Basic  Metabolic Panel-BMET) (80048-METABOL)  Her updated medication list for this problem includes:    Furosemide 40 Mg Tabs (Furosemide) ..... One by mouth daily  BP today: 136/76 Prior BP: 136/72 (01/06/2010)  Prior 10 Yr Risk Heart Disease: Not enough information (05/26/2007)  Labs Reviewed: K+: 4.3 (01/06/2010) Creat: : 1.8 (01/06/2010)   Chol: 213 (07/07/2009)   HDL: 73.00 (07/07/2009)   LDL: DEL (04/05/2008)   TG: 90 (04/05/2008)  Problem # 4:  OSTEOARTHRITIS (ICD-715.90) Assessment: Unchanged  Her updated medication list for this problem includes:    Bayer Aspirin 325 Mg Tabs (Aspirin) ..... Once daily    Mobic 15 Mg Tabs (Meloxicam) .Marland Kitchen... 1 by mouth once daily    Tramadol Hcl 50 Mg Tabs (Tramadol hcl) ..... One by mouth q 6 hours as needed pain  Discussed use of medications, application of heat or cold, and exercises.   Problem # 5:  CHRONIC KIDNEY DISEASE STAGE II (MILD) (ICD-585.2) Assessment: Deteriorated moniter Labs Reviewed: BUN: 39 (01/06/2010)   Cr: 1.8 (01/06/2010)    Hgb: 13.5 (04/05/2008)   Hct: 40.7 (04/05/2008)   Ca++: 9.3 (01/06/2010)     Complete Medication List: 1)  Bayer Aspirin 325 Mg Tabs (Aspirin) .... Once daily 2)  Multivitamins Tabs (Multiple vitamin) .... Once daily 3)  Calcium 600 1500 Mg Tabs (Calcium carbonate) .... Once daily 4)  Furosemide 40 Mg Tabs (Furosemide) .... One by mouth daily 5)  Mobic 15 Mg Tabs (Meloxicam) .Marland Kitchen.. 1 by mouth once daily 6)  Tramadol Hcl 50 Mg Tabs (Tramadol hcl) .... One by mouth q 6 hours as needed pain 7)  Detrol La 4 Mg Xr24h-cap (Tolterodine tartrate) .... One by mouth bid 8)  Klor-con 8 Meq Cr-tabs (Potassium chloride) .... One by mouth  daily 9)  Allopurinol 100 Mg Tabs (Allopurinol) .... One by mouth q hs 10)  Ergocalciferol 50000 Unit Caps (Ergocalciferol) .... One by mouth weekly  Hypertension Assessment/Plan:      The patient's hypertensive risk group is category B: At least one risk factor (excluding  diabetes) with no target organ damage.  Today's blood pressure is 136/76.  Her blood pressure goal is < 140/90.  Patient Instructions: 1)  Please schedule a follow-up appointment in 6 months.   Orders Added: 1)  Venipuncture [91478] 2)  Specimen Handling [99000] 3)  TLB-Uric Acid, Blood [84550-URIC] 4)  TLB-Lipid Panel [80061-LIPID] 5)  TLB-BMP (Basic Metabolic Panel-BMET) [80048-METABOL] 6)  Est. Patient Level IV [29562]

## 2010-05-31 NOTE — Progress Notes (Signed)
  Phone Note Call from Patient   Caller: Patient Call For: Stacie Glaze MD Summary of Call: Needs a list of meds Initial call taken by: [facility] CMA AAMA,  May 22, 2010 11:06 AM  Follow-up for Phone Call        Discussed all meds with son and what they are for. Follow-up by: Lynann Beaver CMA AAMA,  May 22, 2010 11:09 AM

## 2010-06-16 ENCOUNTER — Other Ambulatory Visit: Payer: Self-pay | Admitting: Internal Medicine

## 2010-08-26 ENCOUNTER — Other Ambulatory Visit: Payer: Self-pay | Admitting: Internal Medicine

## 2010-09-05 NOTE — Assessment & Plan Note (Signed)
[facility]                         GASTROENTEROLOGY OFFICE NOTE   CHAI, VERDEJO                    MRN:          [id number]  DATE:05/30/2007                            DOB:          1922-11-25    CHIEF COMPLAINT:  Screening colonoscopy.   NOTE:  The patient has no symptoms at this time.  Because she is over  83, she is seen in the office.  She is asymptomatic with respect to her  GI tract and respect to her medical history form.   PAST MEDICAL HISTORY:  1. Hypertension.  2. Osteoarthritis.  3. Breast cancer in 1993.  4. Mastectomy in 1993.  5. Colonoscopy 1996 showing diverticulosis.  6. Osteoporosis.  7. She also has a history of breast implants and removal.  8. History of tonsillectomy.  9. History of appendectomy.   MEDICATIONS:  1. Bayer aspirin 325 mg daily.  2. Multivitamin daily.  3. Calcium 600 mg daily.  4. Boniva monthly 50 mg.  5. Chlorthalidone 25 mg daily.   SOCIAL HISTORY:  She is widowed.  She lives alone.  Her son is a patient  of mine as well.  No tobacco or drugs.  She worked for [employer].   REVIEW OF SYSTEMS:  Joint pain, back pain, hearing aid.   PHYSICAL EXAMINATION:  VITAL SIGNS:  Height 5'4, weight 118 pounds,  blood pressure 132/80, pulse of 72.  LUNGS:  Clear.  HEART:  S1, S2, no murmurs or gallops.  ABDOMEN:  Soft, nontender.   ASSESSMENT:  This lady is almost 82 and very functional.  It has been 13  years since she has had a colonoscopy.  This is a reasonable thing given  her functional status.  I explained the risks, benefits and indications  of colonoscopy.  I told her that if it was a very difficult exam with  severe  diverticulosis, etc., that I would likely not force the issue.  However,  she will have a screening colonoscopy.     Iva Boop, MD,FACG  Electronically Signed    CEG/MedQ  DD: 05/30/2007  DT: 05/30/2007  Job #: [id number]   cc:   Stacie Glaze, MD

## 2010-09-11 ENCOUNTER — Other Ambulatory Visit: Payer: Self-pay | Admitting: *Deleted

## 2010-09-11 MED ORDER — POTASSIUM CHLORIDE 8 MEQ PO TBCR: 8.0000 meq | EXTENDED_RELEASE_TABLET | Freq: Every day | ORAL | Status: DC

## 2010-09-11 MED ORDER — FUROSEMIDE 40 MG PO TABS: 120.0000 mg | ORAL_TABLET | Freq: Every day | ORAL | Status: DC

## 2010-09-11 MED ORDER — ALLOPURINOL 100 MG PO TABS: 100.0000 mg | ORAL_TABLET | Freq: Every day | ORAL | Status: AC

## 2010-09-11 MED ORDER — TOLTERODINE TARTRATE ER 4 MG PO CP24: 4.0000 mg | ORAL_CAPSULE | Freq: Every day | ORAL | Status: AC

## 2010-09-19 ENCOUNTER — Telehealth: Payer: Self-pay | Admitting: Internal Medicine

## 2010-09-19 MED ORDER — MELOXICAM 15 MG PO TABS: 15.0000 mg | ORAL_TABLET | Freq: Every day | ORAL | Status: DC

## 2010-09-19 NOTE — Telephone Encounter (Signed)
Pts son called and said that [facility] mail order was rcvd, but the Furosemide script is incorrect. Pt was only taking 40 mg once a day, but script for written for 40 mg tid, for total for 120 mg per day. Is this increased amount correct? Pls call in correct dosage amount to [facility], or call in correction of dosage intructions. Also pt did not rcv a script for Meloxicam in her mail order. Pls send refill to [facility].

## 2010-09-19 NOTE — Telephone Encounter (Signed)
Talked with son adn everything has been fixed appropriately

## 2010-10-21 ENCOUNTER — Other Ambulatory Visit: Payer: Self-pay | Admitting: Internal Medicine

## 2010-11-02 ENCOUNTER — Encounter: Payer: Self-pay | Admitting: Internal Medicine

## 2010-11-03 ENCOUNTER — Other Ambulatory Visit: Payer: Self-pay | Admitting: Internal Medicine

## 2010-11-03 ENCOUNTER — Encounter: Payer: Self-pay | Admitting: Internal Medicine

## 2010-11-03 ENCOUNTER — Ambulatory Visit (INDEPENDENT_AMBULATORY_CARE_PROVIDER_SITE_OTHER): Payer: Medicare Other | Admitting: Internal Medicine

## 2010-11-03 VITALS — BP 120/66 | HR 72 | Temp 98.2°F | Resp 16 | Ht 62.0 in | Wt 106.0 lb

## 2010-11-03 DIAGNOSIS — R269 Unspecified abnormalities of gait and mobility: Secondary | ICD-10-CM

## 2010-11-03 DIAGNOSIS — R4182 Altered mental status, unspecified: Secondary | ICD-10-CM

## 2010-11-03 DIAGNOSIS — F09 Unspecified mental disorder due to known physiological condition: Secondary | ICD-10-CM

## 2010-11-03 DIAGNOSIS — M19049 Primary osteoarthritis, unspecified hand: Secondary | ICD-10-CM

## 2010-11-03 DIAGNOSIS — M25559 Pain in unspecified hip: Secondary | ICD-10-CM

## 2010-11-03 DIAGNOSIS — M169 Osteoarthritis of hip, unspecified: Secondary | ICD-10-CM

## 2010-11-03 DIAGNOSIS — F079 Unspecified personality and behavioral disorder due to known physiological condition: Secondary | ICD-10-CM

## 2010-11-03 DIAGNOSIS — M1610 Unilateral primary osteoarthritis, unspecified hip: Secondary | ICD-10-CM

## 2010-11-03 LAB — CBC WITH DIFFERENTIAL/PLATELET
Basophils Absolute: 0 10*3/uL (ref 0.0–0.1)
Basophils Relative: 0.2 % (ref 0.0–3.0)
Eosinophils Absolute: 0.1 10*3/uL (ref 0.0–0.7)
Lymphocytes Relative: 20 % (ref 12.0–46.0)
MCHC: 33.5 g/dL (ref 30.0–36.0)
MCV: 97.1 fl (ref 78.0–100.0)
Monocytes Absolute: 0.3 10*3/uL (ref 0.1–1.0)
Neutro Abs: 7.8 10*3/uL — ABNORMAL HIGH (ref 1.4–7.7)
Neutrophils Relative %: 76.1 % (ref 43.0–77.0)
RBC: 3.45 Mil/uL — ABNORMAL LOW (ref 3.87–5.11)
RDW: 16.4 % — ABNORMAL HIGH (ref 11.5–14.6)

## 2010-11-03 LAB — POCT URINALYSIS DIPSTICK
Blood, UA: NEGATIVE
Glucose, UA: NEGATIVE
Nitrite, UA: NEGATIVE
Spec Grav, UA: 1.005
Urobilinogen, UA: 0.2
pH, UA: 5.5

## 2010-11-03 LAB — VITAMIN B12: Vitamin B-12: 207 pg/mL — ABNORMAL LOW (ref 211–911)

## 2010-11-03 MED ORDER — TRAMADOL HCL 50 MG PO TABS: 50.0000 mg | ORAL_TABLET | Freq: Four times a day (QID) | ORAL | Status: DC | PRN

## 2010-11-03 MED ORDER — METHYLPREDNISOLONE ACETATE 40 MG/ML IJ SUSP: 40.0000 mg | Freq: Once | INTRAMUSCULAR | Status: AC

## 2010-11-03 NOTE — Telephone Encounter (Signed)
Refill sent in and pt informed

## 2010-11-03 NOTE — Patient Instructions (Signed)
Boost or Ensure supplements one shake daily We're going to check her urine for infection a blood count and some vitamin levels and will adjust her medication as necessary it's important to take about a 15 minute walk every day

## 2010-11-03 NOTE — Telephone Encounter (Signed)
Patient was advised today at appt to start taking Tramadol again regularly for arthritis pain. Patient only has one pill remaining. Needs to have refilled  [facility]

## 2010-11-03 NOTE — Progress Notes (Signed)
  Subjective:    Patient ID: Kara Watts, female    DOB: 1923-04-21, 77 y.o.   MRN: [id number]  HPI  Son presents with a 77-year-old female to discuss recent rapid decline in functioning status.  Her mental status her weight her overall ability to function and ambulate has declined significantly since she was seen in January she has severe degenerative joint disease of her with side to side while going when she ambulates she has increased forgetfulness and her sons had to start preparing her meds she has weight loss which may be twisted to forgetting to eat.  Currently she is living alone and driving the end we discussed appropriate safety interventions including preparing her meals medications and withdrawn her driving privileges until we see if there is a reversible etiology for her rapid decline  Review of Systems  Constitutional: Negative for activity change, appetite change and fatigue.  HENT: Negative for ear pain, congestion, neck pain, postnasal drip and sinus pressure.   Eyes: Negative for redness and visual disturbance.  Respiratory: Negative for cough, shortness of breath and wheezing.   Gastrointestinal: Negative for abdominal pain and abdominal distention.  Genitourinary: Negative for dysuria, frequency and menstrual problem.  Musculoskeletal: Positive for joint swelling and arthralgias. Negative for myalgias.  Skin: Negative for rash and wound.  Neurological: Negative for dizziness, weakness and headaches.  Hematological: Negative for adenopathy. Does not bruise/bleed easily.  Psychiatric/Behavioral: Negative for sleep disturbance and decreased concentration.       Objective:   Physical Exam  Constitutional: She is oriented to person, place, and time. She appears well-developed and well-nourished. No distress.  HENT:  Head: Normocephalic and atraumatic.  Right Ear: External ear normal.  Left Ear: External ear normal.  Nose: Nose normal.  Mouth/Throat: Oropharynx is  clear and moist.  Eyes: Conjunctivae and EOM are normal. Pupils are equal, round, and reactive to light.  Neck: Normal range of motion. Neck supple. No JVD present. No tracheal deviation present. No thyromegaly present.  Cardiovascular: Normal rate, regular rhythm, normal heart sounds and intact distal pulses.   No murmur heard. Pulmonary/Chest: Effort normal and breath sounds normal. She has no wheezes. She exhibits no tenderness.  Abdominal: Soft. Bowel sounds are normal.  Musculoskeletal: She exhibits no edema and no tenderness.       Tenderness in the hip with obvious favoring of the left side  Lymphadenopathy:    She has no cervical adenopathy.  Neurological: She is alert and oriented to person, place, and time. She has normal reflexes. No cranial nerve deficit.  Skin: Skin is warm and dry. She is not diaphoretic.  Psychiatric: She has a normal mood and affect. Her behavior is normal.          Assessment & Plan:  Hip pain  Informed consent obtained and the right hip was prepped with Betadine local anesthesia obtained with topical spray 40 mg of Depo-Medrol and 1/2 cc of lidocaine was injected into the joint space the patient tolerated the injection well post injection care discussed with patient ice placed Right hip

## 2010-11-04 LAB — VITAMIN D 25 HYDROXY (VIT D DEFICIENCY, FRACTURES): Vit D, 25-Hydroxy: 57 ng/mL (ref 30–89)

## 2010-11-24 MED ORDER — METHYLPREDNISOLONE ACETATE 40 MG/ML IJ SUSP: 40.0000 mg | Freq: Once | INTRAMUSCULAR | Status: AC

## 2011-01-04 ENCOUNTER — Encounter: Payer: Self-pay | Admitting: Internal Medicine

## 2011-01-04 ENCOUNTER — Ambulatory Visit (INDEPENDENT_AMBULATORY_CARE_PROVIDER_SITE_OTHER): Payer: Medicare Other | Admitting: Internal Medicine

## 2011-01-04 VITALS — BP 107/84 | HR 74 | Temp 98.3°F | Resp 16 | Wt 107.0 lb

## 2011-01-04 DIAGNOSIS — M1990 Unspecified osteoarthritis, unspecified site: Secondary | ICD-10-CM

## 2011-01-04 DIAGNOSIS — I10 Essential (primary) hypertension: Secondary | ICD-10-CM

## 2011-01-04 DIAGNOSIS — R413 Other amnesia: Secondary | ICD-10-CM

## 2011-01-04 DIAGNOSIS — Z23 Encounter for immunization: Secondary | ICD-10-CM

## 2011-01-04 NOTE — Progress Notes (Signed)
Subjective:    Patient ID: Kara Watts, female    DOB: Sep 07, 1922, 77 y.o.   MRN: [id number]  HPI Blood pressure is stable on her current medications OA and dementia detrol has helped at night her urinary frequency and stress incontinence has improved. 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  HENT: Negative for ear pain, congestion, neck pain, postnasal drip and sinus pressure.   Eyes: Negative for redness and visual disturbance.  Respiratory: Negative for cough, shortness of breath and wheezing.   Gastrointestinal: Negative for abdominal pain and abdominal distention.  Genitourinary: Negative for dysuria, frequency and menstrual problem.  Musculoskeletal: Negative for myalgias, joint swelling and arthralgias.  Skin: Negative for rash and wound.  Neurological: Negative for dizziness, weakness and headaches.  Hematological: Negative for adenopathy. Does not bruise/bleed easily.  Psychiatric/Behavioral: Negative for sleep disturbance and decreased concentration.       Past Medical History  Diagnosis Date  . Arthritis   . Osteoporosis   . Breast cancer   . Hx of breast implants, bilateral   . Diverticulosis of colon    Past Surgical History  Procedure Date  . Mastectomy   . Breast enhancement surgery   . Breast implant removed   . Tonsillectomy   . Appendectomy     reports that she has never smoked. She does not have any smokeless tobacco history on file. She reports that she does not drink alcohol. Her drug history not on file. family history includes Breast cancer in an unspecified family member; Prostate cancer in an unspecified family member; and Stroke in an unspecified family member. Allergies  Allergen Reactions  . Codeine     REACTION: Nausea Vomiting    Objective:   Physical Exam    Constitutional: She is oriented to person, place, and time. She appears well-developed and well-nourished. No distress.  HENT:  Head: Normocephalic and atraumatic.  Right Ear: External ear normal.  Left Ear: External ear normal.  Nose: Nose normal.  Mouth/Throat: Oropharynx is clear and moist.  Eyes: Conjunctivae and EOM are normal. Pupils are equal, round, and reactive to light.  Neck: Normal range of motion. Neck supple. No JVD present. No tracheal deviation present. No thyromegaly present.  Cardiovascular: Normal rate, regular rhythm, normal heart sounds and intact distal pulses.   No murmur heard. Pulmonary/Chest: Effort normal and breath sounds normal. She has no wheezes. She exhibits no tenderness.  Abdominal: Soft. Bowel sounds are normal.  Musculoskeletal: Normal range of motion. She exhibits no edema and no tenderness.  Lymphadenopathy:    She has no cervical adenopathy.  Neurological: She is alert and oriented to person, place, and time. She has normal reflexes. No cranial nerve deficit.  Skin: Skin is warm and dry. She is not diaphoretic.  Psychiatric: She has a normal mood and affect. Her behavior is normal.          Assessment & Plan:  Monitor her dementia she appears to be "holding her own" her short-term memory is moderate to poor but she is able to calculate and able to pass the clock test on her Mini-Mental status examination.  Her essential hypertension is stable she reports compliance with her cholesterol medications she reports no chest pain shortness of breath PND orthopnea she is stable on all of her current medications we reviewed her meds with her she'll followup in  4 months time

## 2011-01-08 ENCOUNTER — Ambulatory Visit: Payer: Self-pay | Admitting: Internal Medicine

## 2011-01-11 ENCOUNTER — Other Ambulatory Visit: Payer: Self-pay | Admitting: *Deleted

## 2011-01-18 ENCOUNTER — Encounter: Payer: Self-pay | Admitting: Internal Medicine

## 2011-01-18 NOTE — Patient Instructions (Signed)
Patient was instructed to continue all medications as prescribed. To stop at the checkout desk and schedule a followup appointment  

## 2011-03-10 ENCOUNTER — Other Ambulatory Visit: Payer: Self-pay | Admitting: Internal Medicine

## 2011-03-12 ENCOUNTER — Telehealth: Payer: Self-pay | Admitting: Internal Medicine

## 2011-03-12 NOTE — Telephone Encounter (Signed)
Pts son called and said that pt is having more difficulty being able to move around. Need to see about getting pt a script for a walker. Pts son req call back from nurse.

## 2011-03-12 NOTE — Telephone Encounter (Signed)
Please let son know that script is up front ready for pick up

## 2011-03-12 NOTE — Telephone Encounter (Signed)
Called pts son and notified him that script for walker is ready for pick up.

## 2011-03-19 ENCOUNTER — Telehealth: Payer: Self-pay | Admitting: Internal Medicine

## 2011-03-19 NOTE — Telephone Encounter (Signed)
Would like to speak with Rushie Goltz about his mom's healthcare. Please return call. Thanks.

## 2011-03-20 ENCOUNTER — Other Ambulatory Visit: Payer: Self-pay | Admitting: *Deleted

## 2011-03-20 NOTE — Telephone Encounter (Signed)
Requesting script be faxed to [facility] for hearing test--done

## 2011-04-05 ENCOUNTER — Other Ambulatory Visit: Payer: Self-pay | Admitting: *Deleted

## 2011-04-05 MED ORDER — VITAMIN D (ERGOCALCIFEROL) 1.25 MG (50000 UNIT) PO CAPS: 50000.0000 [IU] | ORAL_CAPSULE | ORAL | Status: AC

## 2011-05-07 ENCOUNTER — Ambulatory Visit (INDEPENDENT_AMBULATORY_CARE_PROVIDER_SITE_OTHER): Payer: Medicare Other | Admitting: Internal Medicine

## 2011-05-07 ENCOUNTER — Encounter: Payer: Self-pay | Admitting: Internal Medicine

## 2011-05-07 VITALS — BP 122/70 | HR 84 | Temp 98.3°F | Resp 18 | Ht 63.0 in | Wt 111.0 lb

## 2011-05-07 DIAGNOSIS — G459 Transient cerebral ischemic attack, unspecified: Secondary | ICD-10-CM

## 2011-05-07 DIAGNOSIS — F0390 Unspecified dementia without behavioral disturbance: Secondary | ICD-10-CM

## 2011-05-07 DIAGNOSIS — R413 Other amnesia: Secondary | ICD-10-CM

## 2011-05-07 DIAGNOSIS — R634 Abnormal weight loss: Secondary | ICD-10-CM

## 2011-05-07 MED ORDER — DONEPEZIL HCL 5 MG PO TABS: 5.0000 mg | ORAL_TABLET | Freq: Every evening | ORAL | Status: DC | PRN

## 2011-05-07 NOTE — Progress Notes (Signed)
Subjective:    Patient ID: Kara Watts, female    DOB: 1922/06/25, 78 y.o.   MRN: [id number]  HPI Patient is an 78 year old white female who presents for follow up of osteoarthritis posterior processes increasing signs of Alzheimer's type dementia.  Her son presents with her and gave her a note prior to her exam. The family has noticed some increased shortness of breath and fatigue they have noticed increased flatulence and they have noticed increased request for pain medications and weakness when arising out of a chair.   Review of Systems  Constitutional: Negative for activity change, appetite change and fatigue.  HENT: Negative for ear pain, congestion, neck pain, postnasal drip and sinus pressure.   Eyes: Negative for redness and visual disturbance.  Respiratory: Negative for cough, shortness of breath and wheezing.   Gastrointestinal: Negative for abdominal pain and abdominal distention.  Genitourinary: Negative for dysuria, frequency and menstrual problem.  Musculoskeletal: Negative for myalgias, joint swelling and arthralgias.  Skin: Negative for rash and wound.  Neurological: Positive for weakness. Negative for dizziness and headaches.  Hematological: Negative for adenopathy. Does not bruise/bleed easily.  Psychiatric/Behavioral: Positive for behavioral problems, confusion, dysphoric mood and decreased concentration. Negative for sleep disturbance.   Past Medical History  Diagnosis Date  . Arthritis   . Osteoporosis   . Breast cancer   . Hx of breast implants, bilateral   . Diverticulosis of colon     History   Social History  . Marital Status: Widowed    Spouse Name: N/A    Number of Children: N/A  . Years of Education: N/A   Occupational History  . Not on file.   Social History Main Topics  . Smoking status: Never Smoker   . Smokeless tobacco: Not on file  . Alcohol Use: No  . Drug Use:   . Sexually Active:    Other Topics Concern  . Not on file    Social History Narrative  . No narrative on file    Past Surgical History  Procedure Date  . Mastectomy   . Breast enhancement surgery   . Breast implant removed   . Tonsillectomy   . Appendectomy     Family History  Problem Relation Age of Onset  . Prostate cancer    . Stroke    . Breast cancer      Allergies  Allergen Reactions  . Codeine     REACTION: Nausea Vomiting    Current Outpatient Prescriptions on File Prior to Visit  Medication Sig Dispense Refill  . allopurinol (ZYLOPRIM) 100 MG tablet Take 1 tablet (100 mg total) by mouth daily.  90 tablet  3  . furosemide (LASIX) 40 MG tablet Take 40 mg by mouth daily.        . meloxicam (MOBIC) 15 MG tablet Take 1 tablet (15 mg total) by mouth daily.  90 tablet  3  . potassium chloride (KLOR-CON) 8 MEQ CR tablet Take 1 tablet (8 mEq total) by mouth daily.  30 tablet  11  . tolterodine (DETROL LA) 4 MG 24 hr capsule Take 1 capsule (4 mg total) by mouth daily.  90 capsule  3  . Vitamin D, Ergocalciferol, (DRISDOL) 50000 UNITS CAPS Take 1 capsule (50,000 Units total) by mouth every 7 (seven) days.  15 capsule  1   Current Facility-Administered Medications on File Prior to Visit  Medication Dose Route Frequency Provider Last Rate Last Dose  . methylPREDNISolone acetate (DEPO-MEDROL) injection 40 mg  40 mg Intra-articular Once Carrie Mew      . methylPREDNISolone acetate (DEPO-MEDROL) injection 40 mg  40 mg Intra-articular Once Carrie Mew        BP 122/70  Pulse 84  Temp 98.3 F (36.8 C)  Resp 18  Ht 5\' 3"  (1.6 m)  Wt 111 lb (50.349 kg)  BMI 19.66 kg/m2       Objective:   Physical Exam  Nursing note and vitals reviewed. Constitutional: She is oriented to person, place, and time. She appears well-developed and well-nourished. No distress.  HENT:  Head: Normocephalic and atraumatic.  Right Ear: External ear normal.  Left Ear: External ear normal.  Nose: Nose normal.  Mouth/Throat:  Oropharynx is clear and moist.  Eyes: Conjunctivae and EOM are normal. Pupils are equal, round, and reactive to light.  Neck: Normal range of motion. Neck supple. No JVD present. No tracheal deviation present. No thyromegaly present.  Cardiovascular: Normal rate, regular rhythm, normal heart sounds and intact distal pulses.   No murmur heard. Pulmonary/Chest: Effort normal and breath sounds normal. She has no wheezes. She exhibits no tenderness.  Abdominal: Soft.       Increased bowel sounds  Musculoskeletal: She exhibits no edema and no tenderness.       Musculoskeletal tenderness especially at the joints  Lymphadenopathy:    She has no cervical adenopathy.  Neurological: She is alert and oriented to person, place, and time. She has normal reflexes. No cranial nerve deficit.  Skin: Skin is warm and dry. She is not diaphoretic.          Assessment & Plan:

## 2011-05-07 NOTE — Patient Instructions (Addendum)
Only take one half of the supplement shake at a time this will decrease the amount of gas in stomach distention associated with the supplements She has done a good job stabilizing her weight with the use of the supplements.  We need to try to be more mobile, as will help both the arthritis and her fall risk, We could send home health care with home physical therapy for a while to see if they could help to mobilize her but first we will try her caregiver to see if she can get her to walk maybe 2-3 laps in the home every day.   Memory is worse Start Aricept  Take a full aspirin(coated)  every day

## 2011-05-24 ENCOUNTER — Other Ambulatory Visit: Payer: Self-pay | Admitting: Internal Medicine

## 2011-08-01 ENCOUNTER — Ambulatory Visit: Payer: Medicare Other | Admitting: Internal Medicine

## 2011-08-01 ENCOUNTER — Ambulatory Visit (INDEPENDENT_AMBULATORY_CARE_PROVIDER_SITE_OTHER): Payer: Medicare Other | Admitting: Internal Medicine

## 2011-08-01 ENCOUNTER — Encounter: Payer: Self-pay | Admitting: Internal Medicine

## 2011-08-01 VITALS — BP 110/60 | HR 80 | Temp 98.2°F | Resp 16 | Ht 64.0 in | Wt 110.0 lb

## 2011-08-01 DIAGNOSIS — R5383 Other fatigue: Secondary | ICD-10-CM

## 2011-08-01 DIAGNOSIS — E785 Hyperlipidemia, unspecified: Secondary | ICD-10-CM

## 2011-08-01 DIAGNOSIS — D649 Anemia, unspecified: Secondary | ICD-10-CM

## 2011-08-01 LAB — CBC WITH DIFFERENTIAL/PLATELET
Basophils Absolute: 0 10*3/uL (ref 0.0–0.1)
Eosinophils Absolute: 0.2 10*3/uL (ref 0.0–0.7)
Hemoglobin: 9.4 g/dL — ABNORMAL LOW (ref 12.0–15.0)
Lymphocytes Relative: 22 % (ref 12.0–46.0)
Lymphs Abs: 2 10*3/uL (ref 0.7–4.0)
MCHC: 32.1 g/dL (ref 30.0–36.0)
MCV: 91.9 fl (ref 78.0–100.0)
Monocytes Absolute: 0.4 10*3/uL (ref 0.1–1.0)
Neutro Abs: 6.7 10*3/uL (ref 1.4–7.7)
RDW: 17.1 % — ABNORMAL HIGH (ref 11.5–14.6)

## 2011-08-01 LAB — BASIC METABOLIC PANEL
BUN: 56 mg/dL — ABNORMAL HIGH (ref 6–23)
CO2: 28 mEq/L (ref 19–32)
Calcium: 8.4 mg/dL (ref 8.4–10.5)
GFR: 20.59 mL/min — ABNORMAL LOW (ref >60.00)
Glucose, Bld: 88 mg/dL (ref 70–99)
Potassium: 4.2 mEq/L (ref 3.5–5.1)

## 2011-08-01 NOTE — Progress Notes (Signed)
Subjective:    Patient ID: Kara Watts, female    DOB: [birth year], 78 y.o.   MRN: [id number]  HPI patient is an 78 year old female who presents for followup of osteoarthritis a history of mild peripheral edema treated with compression stockings and Lasix and memory disorder of age  She presents today wearing her compression stockings but her son accompanies her and relates a history that she is not 100% compliance with her stockings..  She has not required additional pain medications and is using her Ultram on a minimal basis.  She has not had a flare of gout since her last office visit she has essential hypertension and her blood pressure today is stable at 110/80    Review of Systems  Constitutional: Positive for appetite change and fatigue.  HENT: Positive for hearing loss and rhinorrhea. Negative for neck stiffness.   Eyes: Positive for redness.  Respiratory: Negative for cough, choking, shortness of breath and wheezing.   Cardiovascular: Positive for leg swelling.  Gastrointestinal: Negative for abdominal pain and abdominal distention.  Genitourinary: Positive for urgency and frequency. Negative for pelvic pain.  Musculoskeletal: Positive for back pain and gait problem.  Neurological: Negative for light-headedness and headaches.  Psychiatric/Behavioral: Positive for confusion and decreased concentration. Negative for behavioral problems.   Past Medical History  Diagnosis Date  . Arthritis   . Osteoporosis   . Breast cancer   . Hx of breast implants, bilateral   . Diverticulosis of colon     History   Social History  . Marital Status: Widowed    Spouse Name: N/A    Number of Children: N/A  . Years of Education: N/A   Occupational History  . Not on file.   Social History Main Topics  . Smoking status: Never Smoker   . Smokeless tobacco: Not on file  . Alcohol Use: No  . Drug Use:   . Sexually Active:    Other Topics Concern  . Not on file   Social  History Narrative  . No narrative on file    Past Surgical History  Procedure Date  . Mastectomy   . Breast enhancement surgery   . Breast implant removed   . Tonsillectomy   . Appendectomy     Family History  Problem Relation Age of Onset  . Prostate cancer    . Stroke    . Breast cancer      Allergies  Allergen Reactions  . Codeine     REACTION: Nausea Vomiting    Current Outpatient Prescriptions on File Prior to Visit  Medication Sig Dispense Refill  . allopurinol (ZYLOPRIM) 100 MG tablet Take 1 tablet (100 mg total) by mouth daily.  90 tablet  3  . Cyanocobalamin (B-12 DOTS SL) Place under the tongue daily.      . furosemide (LASIX) 40 MG tablet Take 40 mg by mouth daily.        . meloxicam (MOBIC) 15 MG tablet Take 1 tablet (15 mg total) by mouth daily.  90 tablet  3  . potassium chloride (KLOR-CON) 8 MEQ CR tablet Take 1 tablet (8 mEq total) by mouth daily.  30 tablet  11  . tolterodine (DETROL LA) 4 MG 24 hr capsule Take 1 capsule (4 mg total) by mouth daily.  90 capsule  3  . traMADol (ULTRAM) 50 MG tablet Take 50 mg by mouth daily.      . traMADol (ULTRAM) 50 MG tablet TAKE 1 TABLET BY MOUTH EVERY 6  HOURS AS NEEDED  30 tablet  3  . Vitamin D, Ergocalciferol, (DRISDOL) 50000 UNITS CAPS Take 1 capsule (50,000 Units total) by mouth every 7 (seven) days.  15 capsule  1  . DISCONTD: furosemide (LASIX) 40 MG tablet Take 3 tablets (120 mg total) by mouth daily.  90 tablet  3   Current Facility-Administered Medications on File Prior to Visit  Medication Dose Route Frequency Provider Last Rate Last Dose  . methylPREDNISolone acetate (DEPO-MEDROL) injection 40 mg  40 mg Intra-articular Once Stacie Glaze, MD      . methylPREDNISolone acetate (DEPO-MEDROL) injection 40 mg  40 mg Intra-articular Once Stacie Glaze, MD        BP 110/60  Pulse 80  Temp 98.2 F (36.8 C)  Resp 16  Ht 5\' 4"  (1.626 m)  Wt 110 lb (49.896 kg)  BMI 18.88 kg/m2       Objective:    Physical Exam  Nursing note and vitals reviewed. Constitutional: She appears well-developed and well-nourished.  HENT:  Head: Normocephalic and atraumatic.  Eyes: Conjunctivae are normal. Pupils are equal, round, and reactive to light.  Neck: Normal range of motion. Neck supple.  Cardiovascular: Normal rate.  Exam reveals gallop.   Murmur heard. Abdominal: She exhibits no distension. There is no tenderness.  Musculoskeletal: She exhibits edema.  Skin: Skin is warm and dry.          Assessment & Plan:  Patient is an elderly white female with advancing dementia she has not responded to the Aricept therefore we will discontinue the medication she continues to have well-controlled hypertension she wears her support hose for prevention of edema in her lower extremities.  We will monitor renal function today and make sure she is remaining hydrated we will look at her thyroid profile due to her increased fatigue she is just a mild to moderate anemia associated to see this will be ordered.  Otherwise her care plan is the same she has a moderate fall risk but that is lessened by using her walker her son continuously remind her to walk only with a walker.

## 2011-08-01 NOTE — Patient Instructions (Addendum)
Due to your seasonal allergies and runny nose consider taking her Claritin or the equivalent to loratadine once a day for the next 6 weeks We are going to stop the Aricept and monitor if there is a significant change resume the Aricept and notify us

## 2011-08-21 ENCOUNTER — Telehealth: Payer: Self-pay | Admitting: *Deleted

## 2011-08-21 NOTE — Telephone Encounter (Signed)
Pt's son is calling to let Dr. Lovell Sheehan Know his Mom is getting weaker and weaker moving around.  He needs to know if Dr. Lovell Sheehan should see her sooner that planned.  She has a good appetite, but the caretakers think she is going downhill fast.

## 2011-08-22 ENCOUNTER — Telehealth: Payer: Self-pay | Admitting: *Deleted

## 2011-08-22 NOTE — Telephone Encounter (Signed)
Talked with son an d he states bp is normal when pt is sitting and th en he takes orthostatic bp and it drops to 80/40.will pass along to dr Haynes Hoehn take lasix

## 2011-08-22 NOTE — Telephone Encounter (Signed)
Per dr Lovell Sheehan- cut lasix to 20 qd and take potassium qod--randy,son informed

## 2011-08-22 NOTE — Telephone Encounter (Signed)
(  triage voicemail) Pt's son Harvie Heck calling stating that he had spoken with [facility] yesterday and she was going to consult with Dr. Lovell Sheehan this morning and call back.  However, the son has some updated information to share with [facility] before she speaks with Dr. Lovell Sheehan.  Asking for a return call.

## 2011-08-31 ENCOUNTER — Telehealth: Payer: Self-pay | Admitting: Internal Medicine

## 2011-08-31 ENCOUNTER — Encounter (HOSPITAL_COMMUNITY): Payer: Self-pay | Admitting: *Deleted

## 2011-08-31 ENCOUNTER — Emergency Department (HOSPITAL_COMMUNITY)
Admission: EM | Admit: 2011-08-31 | Discharge: 2011-08-31 | Disposition: A | Discharge status: Home / Self Care | Payer: Medicare Other | Attending: Emergency Medicine | Admitting: Emergency Medicine

## 2011-08-31 DIAGNOSIS — E860 Dehydration: Secondary | ICD-10-CM | POA: Insufficient documentation

## 2011-08-31 DIAGNOSIS — R531 Weakness: Secondary | ICD-10-CM

## 2011-08-31 DIAGNOSIS — Z853 Personal history of malignant neoplasm of breast: Secondary | ICD-10-CM | POA: Insufficient documentation

## 2011-08-31 DIAGNOSIS — R5381 Other malaise: Secondary | ICD-10-CM | POA: Insufficient documentation

## 2011-08-31 LAB — URINALYSIS, ROUTINE W REFLEX MICROSCOPIC
Hgb urine dipstick: NEGATIVE
Ketones, ur: NEGATIVE mg/dL
Leukocytes, UA: NEGATIVE
Protein, ur: NEGATIVE mg/dL
Urobilinogen, UA: 0.2 mg/dL (ref 0.0–1.0)

## 2011-08-31 LAB — CARDIAC PANEL(CRET KIN+CKTOT+MB+TROPI)
CK, MB: 1.5 ng/mL (ref 0.3–4.0)
Relative Index: INVALID (ref 0.0–2.5)
Troponin I: <0.3 ng/mL (ref <0.30)

## 2011-08-31 LAB — DIFFERENTIAL
Basophils Absolute: 0 10*3/uL (ref 0.0–0.1)
Basophils Relative: 0 % (ref 0–1)
Eosinophils Absolute: 0.2 10*3/uL (ref 0.0–0.7)
Eosinophils Relative: 2 % (ref 0–5)

## 2011-08-31 LAB — CBC
MCH: 28.4 pg (ref 26.0–34.0)
MCHC: 32.7 g/dL (ref 30.0–36.0)
MCV: 86.9 fL (ref 78.0–100.0)
Platelets: 225 10*3/uL (ref 150–400)
RDW: 17.4 % — ABNORMAL HIGH (ref 11.5–15.5)
WBC: 9 10*3/uL (ref 4.0–10.5)

## 2011-08-31 LAB — COMPREHENSIVE METABOLIC PANEL
ALT: 8 U/L (ref 0–35)
AST: 31 U/L (ref 0–37)
Calcium: 8 mg/dL — ABNORMAL LOW (ref 8.4–10.5)
Creatinine, Ser: 2.23 mg/dL — ABNORMAL HIGH (ref 0.50–1.10)
Sodium: 138 mEq/L (ref 135–145)
Total Protein: 5.3 g/dL — ABNORMAL LOW (ref 6.0–8.3)

## 2011-08-31 MED ORDER — SODIUM CHLORIDE 0.9 % IV SOLN
Freq: Once | INTRAVENOUS | Status: AC
  Administered 2011-08-31: 16:00:00 via INTRAVENOUS

## 2011-08-31 NOTE — ED Notes (Signed)
Per ems: pt is having decrease in po intake and weakness. Drop in bp when standing. No other c/o

## 2011-08-31 NOTE — ED Provider Notes (Addendum)
History     CSN: [id number]  Arrival date & time 08/31/11  1311   First MD Initiated Contact with Patient 08/31/11 1335      Chief Complaint  Patient presents with  . Weakness  . Failure To Thrive    (Consider location/radiation/quality/duration/timing/severity/associated sxs/prior treatment) HPI Comments: Brought by son for eval of progressive weakness for the past several weeks.  He states that she is unable to ambulate more than a few feet before becoming weak and nearly passes out.  Her blood pressures have been low at home when ambulating.  She was seen by her pcp and told that she may be dehydrated.  She was sent here by her pcp.  Patient is a 78 y.o. female presenting with weakness. The history is provided by the patient and a relative.  Weakness Primary symptoms do not include loss of consciousness, fever, nausea or vomiting. Episode onset: 2-3 weeks ago. The symptoms are worsening.  Additional symptoms include weakness.    Past Medical History  Diagnosis Date  . Arthritis   . Osteoporosis   . Breast cancer   . Hx of breast implants, bilateral   . Diverticulosis of colon     Past Surgical History  Procedure Date  . Mastectomy   . Breast enhancement surgery   . Breast implant removed   . Tonsillectomy   . Appendectomy     Family History  Problem Relation Age of Onset  . Prostate cancer    . Stroke    . Breast cancer      History  Substance Use Topics  . Smoking status: Never Smoker   . Smokeless tobacco: Not on file  . Alcohol Use: No    OB History    Grav Para Term Preterm Abortions TAB SAB Ect Mult Living                  Review of Systems  Constitutional: Negative for fever.  Gastrointestinal: Negative for nausea and vomiting.  Neurological: Positive for weakness. Negative for loss of consciousness.  All other systems reviewed and are negative.    Allergies  Codeine  Home Medications   Current Outpatient Rx  Name Route Sig Dispense  Refill  . ALLOPURINOL 100 MG PO TABS Oral Take 1 tablet (100 mg total) by mouth daily. 90 tablet 3  . ASPIRIN 325 MG PO TABS Oral Take 325 mg by mouth at bedtime.     . B-12 DOTS SL Sublingual Place under the tongue daily.    . FUROSEMIDE 40 MG PO TABS Oral Take 40 mg by mouth daily. Take one tablet every other day    . MELOXICAM 15 MG PO TABS Oral Take 15 mg by mouth at bedtime.    Marland Kitchen POTASSIUM CHLORIDE ER 8 MEQ PO TBCR Oral Take 4 mEq by mouth daily. Take 1/2 tablet    . TOLTERODINE TARTRATE ER 4 MG PO CP24 Oral Take 1 capsule (4 mg total) by mouth daily. 90 capsule 3  . TRAMADOL HCL 50 MG PO TABS  TAKE 1 TABLET BY MOUTH EVERY 6 HOURS AS NEEDED 30 tablet 3  . VITAMIN D (ERGOCALCIFEROL) 50000 UNITS PO CAPS Oral Take 1 capsule (50,000 Units total) by mouth every 7 (seven) days. 15 capsule 1    BP 134/59  Pulse 95  Temp(Src) 97.9 F (36.6 C) (Oral)  Resp 16  SpO2 99%  Physical Exam  Nursing note and vitals reviewed. Constitutional: She is oriented to person, place, and time.  She appears well-developed and well-nourished. No distress.  HENT:  Head: Normocephalic and atraumatic.  Neck: Normal range of motion. Neck supple.  Cardiovascular: Normal rate and regular rhythm.   No murmur heard. Pulmonary/Chest: Effort normal and breath sounds normal. No respiratory distress. She has no wheezes.  Abdominal: Soft. Bowel sounds are normal. She exhibits no distension. There is no tenderness.  Musculoskeletal: Normal range of motion. She exhibits no edema.  Neurological: She is alert and oriented to person, place, and time.  Skin: Skin is warm and dry. She is not diaphoretic.    ED Course  Procedures (including critical care time)  Labs Reviewed  CBC - Abnormal; Notable for the following:    RBC 3.13 (*)    Hemoglobin 8.9 (*)    HCT 27.2 (*)    RDW 17.4 (*)    All other components within normal limits  DIFFERENTIAL - Abnormal; Notable for the following:    Neutrophils Relative 78 (*)      All other components within normal limits  COMPREHENSIVE METABOLIC PANEL - Abnormal; Notable for the following:    BUN 60 (*)    Creatinine, Ser 2.23 (*)    Calcium 8.0 (*)    Total Protein 5.3 (*)    Albumin 1.6 (*)    Alkaline Phosphatase 176 (*)    Total Bilirubin 0.2 (*)    GFR calc non Af Amer 19 (*)    GFR calc Af Amer 22 (*)    All other components within normal limits  PRO B NATRIURETIC PEPTIDE - Abnormal; Notable for the following:    Pro B Natriuretic peptide (BNP) 5164.0 (*)    All other components within normal limits  CARDIAC PANEL(CRET KIN+CKTOT+MB+TROPI)  URINALYSIS, ROUTINE W REFLEX MICROSCOPIC   No results found.   No diagnosis found.   Date: 08/31/2011  Rate: 79  Rhythm: normal sinus rhythm  QRS Axis: normal  Intervals: normal  ST/T Wave abnormalities: normal  Conduction Disutrbances:none  Narrative Interpretation:   Old EKG Reviewed: none available    MDM  The patient appears clinically dehydrated and the labs show an elevated bun/cr.  There is an elevation of bnp but she appears more dry than in chf.  Her oxygen sats are 100% on room air and there are no crackles on exam.  I spoke at length with the son who basically does not believe she is safe at home as she is at risk for falls.  She will be hydrated and I will consult internal medicine for admission.  I spoke with Dr. Ardyth Harps who does not believe she meets the criteria for admission.  She will discharged to home with a referral placed to social work for follow up.        Geoffery Lyons, MD 08/31/11 1559  Geoffery Lyons, MD 08/31/11 1621  Geoffery Lyons, MD 08/31/11 1610  Geoffery Lyons, MD 08/31/11 1640  Geoffery Lyons, MD 08/31/11 [phone]

## 2011-08-31 NOTE — Telephone Encounter (Signed)
Patient son called stating that he would like a call back to discuss drastic changes with his mom since last visit. Please assist.

## 2011-08-31 NOTE — ED Notes (Signed)
POE:UM35<TI> Expected date:08/31/11<BR> Expected time:<BR> Means of arrival:<BR> Comments:<BR> EMS 33 Ptar - weakness

## 2011-08-31 NOTE — Progress Notes (Signed)
Called by Dr. Judd Lien, EDP, for potential admission to the hospital for Kara Watts. Was brought in to the ED by her son today with progressive generalized weakness, low BPs at home. Son is interested in seeking placement for his mother. Review of comprehensive workup done in the ED so far shows normal BPs (not orthostatic), CKD (at baseline) and chronic anemia that is not worse since 1 month ago when checked at her PCPs office. I cannot document a medical reason that would warrant an inpatient admission. Without her being an inpatient she will not be able to be placed from the hospital because of lack of insurance coverage, unless the family is willing to pay out of pocket for the SNF. I have explained this to Dr. Judd Lien, who will discuss with her son. I have also contacted [facility] [place] [facility] who is covering the ED today who will followup.  Peggye Pitt, MD [facility] Pager: [phone]

## 2011-08-31 NOTE — Discharge Instructions (Signed)
Weakness  Weakness can be caused by many things. Your doctor has checked for the most common causes.  HOME CARE   Rest.   Eat a well-balanced diet.   Exercise every day.   Go to follow-up appointments.  GET HELP RIGHT AWAY IF:    There is chest pain or chest pressure.   You have problems breathing.   You cannot do usual daily activities.   You have problems speaking or swallowing.   You have a very bad headache or belly (abdominal) pain.   The heartbeat does not feel normal or the pulse is very fast.   You are confused.   You have vision problems or problems walking.   You have chills.   You or your child has a temperature by mouth above 102 F (38.9 C), not controlled by medicine.   Your baby is older than 3 months with a rectal temperature of 102 F (38.9 C) or higher.   Your baby is 3 months old or younger with a rectal temperature of 100.4 F (38 C) or higher.  MAKE SURE YOU:    Understand these instructions.   Will watch this condition.   Will get help right away if you are not doing well or get worse.  Document Released: 03/22/2008 Document Revised: 03/29/2011 Document Reviewed: 03/22/2008  [employer] Patient Information 2012 [employer].

## 2011-08-31 NOTE — Telephone Encounter (Signed)
Per dr Lovell Sheehan- probably dehydrated . Can take to ed for evaluation- cant given ivs here

## 2011-08-31 NOTE — Telephone Encounter (Signed)
Spoke with son - randy - would like to talk with bonnie about drastic changes in his mother since dr. Lovell Sheehan saw her last - urine darker, difficulty doing anything, walking - cant do , no energy- he is worried about oxygen levels - but no SOB , responds ok to questions - please call at work [phone] - if she needs to come in - he will have to arrange transport since she is not able to walk to car.

## 2011-08-31 NOTE — Progress Notes (Signed)
CM spoke with son who states pt has not used home health agency He has been informed by EDP pt is ready for d/c. States had someone he was paying to stay with pt to assist during day but not at night.  He is only relative and works Husband and another son passed.  He has contacted someone else to stay with pt at night Pt preferring at this time to remain at home.  States presently has issues covered.  Cm explained differences in home health, private duty, assisted living and skill nursing Explain how pcp can assist with further services (home health, facilities) when he and pt decide they are needed.  Reviewed HHRN, PT/OT, aide and SW. Voiced understanding Provided with written lists of resources reviewed including cm contact information for further question.  Appreciative of resources Provided CMS information on home health services

## 2011-09-03 ENCOUNTER — Other Ambulatory Visit: Payer: Self-pay | Admitting: Internal Medicine

## 2011-09-03 ENCOUNTER — Telehealth: Payer: Self-pay | Admitting: Family Medicine

## 2011-09-03 ENCOUNTER — Telehealth: Payer: Self-pay | Admitting: Internal Medicine

## 2011-09-03 DIAGNOSIS — R627 Adult failure to thrive: Secondary | ICD-10-CM

## 2011-09-03 NOTE — Telephone Encounter (Signed)
[facility] Triage Call Report Triage Record Num: [id number] Operator: Amy Head Patient Name: Kara Watts Call Date & Time: 09/02/2011 12:04:05PM Patient Phone: [phone] PCP: Darryll Capers Patient Gender: Female PCP Fax : [phone] Patient DOB: 1922-08-04 [facility] Name: [facility] Reason for Call: Caller: Randy/Other; PCP: Darryll Capers; CB#: [phone]; Call regarding Restlessness; Has been so restless all through the night and today. Wants something called in to "take the edge off while she is dying." Dr. Lovell Sheehan advised that pt is dying and there is nothing else that can be done. Pt has no pain. Spoke with Dr. Laury Axon, order given for Ativen 0.5MG  QHS, but may take TID if needed PRN, #30 no refills. May take one dose now. Called in to [facility]. Protocol(s) Used: Sleep Disorders Recommended Outcome per Protocol: See Provider within 24 hours Override Outcome if Used in Protocol: Provide Home/Self Care RN Reason for Override Outcome: Physician Orders. Reason for Outcome: Sleep deprivation AND irritable, agitated or multiple episodes of apnea per night Care Advice: ~ SYMPTOM / CONDITION MANAGEMENT 05/

## 2011-09-03 NOTE — Telephone Encounter (Signed)
Patient's son called stating he would like a call back from the nurse so that he may update her. Please assist.

## 2011-09-03 NOTE — Telephone Encounter (Signed)
Hospice referral sent to [facility]

## 2011-09-03 NOTE — Telephone Encounter (Signed)
Per dr Yvonne Kendall have referral to hospice--

## 2011-09-05 ENCOUNTER — Telehealth: Payer: Self-pay | Admitting: Internal Medicine

## 2011-09-05 NOTE — Telephone Encounter (Signed)
[facility] called and said that the pt desires to be a DNR and Hospice is wanting to know if it is ok for hospice doctor to sign the form? Pls call asap.

## 2011-09-05 NOTE — Telephone Encounter (Signed)
Hospice informed it is ok per dr Lovell Sheehan

## 2011-09-14 DIAGNOSIS — R634 Abnormal weight loss: Secondary | ICD-10-CM

## 2011-09-14 DIAGNOSIS — F0280 Dementia in other diseases classified elsewhere without behavioral disturbance: Secondary | ICD-10-CM

## 2011-09-14 DIAGNOSIS — G309 Alzheimer's disease, unspecified: Secondary | ICD-10-CM

## 2011-09-14 DIAGNOSIS — R1310 Dysphagia, unspecified: Secondary | ICD-10-CM

## 2011-09-22 DEATH — deceased

## 2011-11-30 ENCOUNTER — Ambulatory Visit: Payer: Medicare Other | Admitting: Internal Medicine
